# Patient Record
Sex: Male | Born: 1960
Health system: Southern US, Community
[De-identification: ages and names within clinical notes are randomized; demographics above are authoritative.]

## PROBLEM LIST (undated history)

## (undated) DIAGNOSIS — R519 Headache, unspecified: Secondary | ICD-10-CM

## (undated) DIAGNOSIS — E785 Hyperlipidemia, unspecified: Secondary | ICD-10-CM

## (undated) DIAGNOSIS — K219 Gastro-esophageal reflux disease without esophagitis: Secondary | ICD-10-CM

## (undated) DIAGNOSIS — G4730 Sleep apnea, unspecified: Secondary | ICD-10-CM

## (undated) DIAGNOSIS — Z87442 Personal history of urinary calculi: Secondary | ICD-10-CM

## (undated) DIAGNOSIS — I10 Essential (primary) hypertension: Secondary | ICD-10-CM

## (undated) HISTORY — PX: TONSILLECTOMY: SUR1361

## (undated) HISTORY — PX: PLANTAR FASCIA RELEASE: SHX2239

## (undated) HISTORY — PX: ROTATOR CUFF REPAIR: SHX139

## (undated) HISTORY — PX: CHOLECYSTECTOMY: SHX55

---

## 2011-02-21 ENCOUNTER — Observation Stay: Payer: Self-pay | Admitting: Surgery

## 2012-01-08 ENCOUNTER — Ambulatory Visit: Payer: Self-pay | Admitting: Radiology

## 2012-01-08 ENCOUNTER — Ambulatory Visit: Payer: Self-pay | Admitting: Orthopedic Surgery

## 2012-04-21 ENCOUNTER — Ambulatory Visit: Payer: Self-pay | Admitting: Unknown Physician Specialty

## 2013-01-27 ENCOUNTER — Emergency Department: Payer: Self-pay | Admitting: Emergency Medicine

## 2013-02-08 ENCOUNTER — Ambulatory Visit: Payer: Self-pay | Admitting: Gastroenterology

## 2014-05-28 DIAGNOSIS — K2100 Gastro-esophageal reflux disease with esophagitis, without bleeding: Secondary | ICD-10-CM | POA: Insufficient documentation

## 2014-05-28 DIAGNOSIS — K31A0 Gastric intestinal metaplasia, unspecified: Secondary | ICD-10-CM | POA: Insufficient documentation

## 2014-05-28 DIAGNOSIS — K3189 Other diseases of stomach and duodenum: Secondary | ICD-10-CM | POA: Insufficient documentation

## 2014-06-23 ENCOUNTER — Ambulatory Visit: Payer: Self-pay | Admitting: Anesthesiology

## 2014-06-23 DIAGNOSIS — I10 Essential (primary) hypertension: Secondary | ICD-10-CM

## 2014-06-27 ENCOUNTER — Ambulatory Visit: Payer: Self-pay | Admitting: Otolaryngology

## 2014-08-07 NOTE — Op Note (Signed)
PATIENT NAME:  Jonathan Lester, Jonathan Lester MR#:  [id number] DATE OF BIRTH:  1960-10-16  DATE OF PROCEDURE:  06/27/2014  PREOPERATIVE DIAGNOSES:  1.  Septal deviation.  2.  Turbinate hypertrophy.  3.  Airway obstruction.   POSTOPERATIVE DIAGNOSES:  1.  Septal deviation.  2.  Turbinate hypertrophy.  3.  Airway obstruction.   OPERATIVE PROCEDURES: 1.  Septoplasty.  2.  Bilateral inferior turbinate partial reduction.   SURGEON: Cammy CopaPaul H. Jalicia Roszak, M.Lester.   ANESTHESIA: General.   COMPLICATIONS: None.   DESCRIPTION OF PROCEDURE: The patient was given general anesthesia by oral endotracheal intubation. His nose was prepped using 6 mL of 1% Xylocaine with epi 1:200,000 for infiltration of the nasal septum and lateral nasal walls. Cottonoid pledgets soaked in phenylephrine and Xylocaine were then placed in both sides for vasoconstriction. He was prepped and draped in sterile fashion.   Cottonoid pledgets were removed. The nose was inspected on both sides. The septum had a very large spur inferiorly on the left side that went back and blocked much of the lower airway on the left side. The quadrangular cartilage and ethmoid plate buckled towards the right side superiorly. A left Killian incision was created with elevation of mucoperichondrium on the left side of the quadrangular plate. The cartilage was split just above the maxillary crest and at the bony cartilaginous junction. Some of the posterior-inferior quadrangular plate was removed. The quadrangular plate was horizontal as it coursed across the maxillary crest and pushed out into the left side. This lower portion was removed. Some of the crooked ethmoid plate was removed and this allowed the quadrangular plate to fall back more towards the midline. Maxillary crest was buckled way over to the left side. Mucoperiosteum was elevated on both sides of the maxillary crest and it was fractured more midline and some of the crooked part was removed. There was a vomer  spur posteriorly on the left side and this was removed as well. The mucosal flaps were then placed back in their anatomic position. There was a tear on the left side inferiorly where the septal spur was. This was sewn together with a through and through suture. A 4-0 plain gut suture was used to close that and the Killian incision and hold the flaps together in the midline. Once this was done you could see that the septum was lying in good position in the middle. The inferior turbinates were trimmed along their anterior-inferior border. Right side was bigger and had to be trimmed a little bit more. Electrocautery was used along the anterior/inferior border to help control bleeding. The remaining portion of the turbinate was then outfractured to help open up the airway further. The airways were then wide open on both sides. The middle turbinates were infractured some and a piece of xerogel was placed in the middle meatus on both sides to help keep the middle meatus more open. Xomed 0.5 mm regular size splints were then trimmed and placed on both sides of the septum, held in position with a 3-0 nylon through and through suture.   The patient tolerated the procedure well. He was awakened and taken to the recovery room in satisfactory condition. There were no operative complications.  ____________________________ Cammy CopaPaul H. Carole Doner, MD phj:sb Lester: 06/27/2014 10:30:39 ET T: 06/27/2014 10:48:39 ET JOB#: [id number]  cc: Cammy CopaPaul H. Braylei Totino, MD, <Dictator> Cammy CopaPAUL H Graceanne Guin MD ELECTRONICALLY SIGNED 06/28/2014 8:36

## 2016-04-19 ENCOUNTER — Ambulatory Visit
Admission: RE | Admit: 2016-04-19 | Discharge: 2016-04-19 | Disposition: A | Discharge status: Home / Self Care | Payer: BC Managed Care – PPO | Source: Ambulatory Visit | Attending: Internal Medicine | Admitting: Internal Medicine

## 2016-04-19 ENCOUNTER — Other Ambulatory Visit: Payer: Self-pay | Admitting: Internal Medicine

## 2016-04-19 DIAGNOSIS — R05 Cough: Secondary | ICD-10-CM

## 2016-04-19 DIAGNOSIS — R059 Cough, unspecified: Secondary | ICD-10-CM

## 2016-04-19 DIAGNOSIS — R509 Fever, unspecified: Secondary | ICD-10-CM | POA: Insufficient documentation

## 2017-01-23 ENCOUNTER — Encounter: Payer: Self-pay | Admitting: Emergency Medicine

## 2017-01-23 ENCOUNTER — Emergency Department
Admission: EM | Admit: 2017-01-23 | Discharge: 2017-01-23 | Disposition: A | Discharge status: Home / Self Care | Payer: BC Managed Care – PPO | Attending: Emergency Medicine | Admitting: Emergency Medicine

## 2017-01-23 ENCOUNTER — Emergency Department: Payer: BC Managed Care – PPO

## 2017-01-23 DIAGNOSIS — R109 Unspecified abdominal pain: Secondary | ICD-10-CM

## 2017-01-23 DIAGNOSIS — I10 Essential (primary) hypertension: Secondary | ICD-10-CM | POA: Diagnosis not present

## 2017-01-23 DIAGNOSIS — N200 Calculus of kidney: Secondary | ICD-10-CM | POA: Diagnosis not present

## 2017-01-23 HISTORY — DX: Essential (primary) hypertension: I10

## 2017-01-23 LAB — COMPREHENSIVE METABOLIC PANEL
ALK PHOS: 33 U/L — AB (ref 38–126)
ALT: 27 U/L (ref 17–63)
AST: 22 U/L (ref 15–41)
Albumin: 4.9 g/dL (ref 3.5–5.0)
Anion gap: 8 (ref 5–15)
BILIRUBIN TOTAL: 0.7 mg/dL (ref 0.3–1.2)
BUN: 27 mg/dL — ABNORMAL HIGH (ref 6–20)
CALCIUM: 9.2 mg/dL (ref 8.9–10.3)
CO2: 26 mmol/L (ref 22–32)
CREATININE: 1.18 mg/dL (ref 0.61–1.24)
Chloride: 104 mmol/L (ref 101–111)
Glucose, Bld: 115 mg/dL — ABNORMAL HIGH (ref 65–99)
Potassium: 3.7 mmol/L (ref 3.5–5.1)
Sodium: 138 mmol/L (ref 135–145)
TOTAL PROTEIN: 7.9 g/dL (ref 6.5–8.1)

## 2017-01-23 LAB — CBC
HCT: 39.7 % — ABNORMAL LOW (ref 40.0–52.0)
HEMOGLOBIN: 13.6 g/dL (ref 13.0–18.0)
MCH: 29.2 pg (ref 26.0–34.0)
MCHC: 34.2 g/dL (ref 32.0–36.0)
MCV: 85.3 fL (ref 80.0–100.0)
PLATELETS: 288 10*3/uL (ref 150–440)
RBC: 4.66 MIL/uL (ref 4.40–5.90)
RDW: 13.3 % (ref 11.5–14.5)
WBC: 9.1 10*3/uL (ref 3.8–10.6)

## 2017-01-23 LAB — URINALYSIS, COMPLETE (UACMP) WITH MICROSCOPIC
Bilirubin Urine: NEGATIVE
GLUCOSE, UA: NEGATIVE mg/dL
Hgb urine dipstick: NEGATIVE
KETONES UR: NEGATIVE mg/dL
Leukocytes, UA: NEGATIVE
NITRITE: NEGATIVE
PH: 6 (ref 5.0–8.0)
Protein, ur: NEGATIVE mg/dL
SPECIFIC GRAVITY, URINE: 1.016 (ref 1.005–1.030)
Squamous Epithelial / LPF: NONE SEEN

## 2017-01-23 LAB — LIPASE, BLOOD: Lipase: 19 U/L (ref 11–51)

## 2017-01-23 MED ORDER — OXYCODONE-ACETAMINOPHEN 5-325 MG PO TABS: 1.0000 | ORAL_TABLET | ORAL | 0 refills | Status: DC | PRN

## 2017-01-23 MED ORDER — ONDANSETRON 4 MG PO TBDP: 4.0000 mg | ORAL_TABLET | Freq: Three times a day (TID) | ORAL | 0 refills | Status: DC | PRN

## 2017-01-23 MED ORDER — TAMSULOSIN HCL 0.4 MG PO CAPS: 0.4000 mg | ORAL_CAPSULE | Freq: Every day | ORAL | 0 refills | Status: AC

## 2017-01-23 MED ORDER — SODIUM CHLORIDE 0.9 % IV BOLUS (SEPSIS)
1000.0000 mL | Freq: Once | INTRAVENOUS | Status: AC
  Administered 2017-01-23: 1000 mL via INTRAVENOUS

## 2017-01-23 MED ORDER — IBUPROFEN 800 MG PO TABS: 800.0000 mg | ORAL_TABLET | Freq: Three times a day (TID) | ORAL | 0 refills | Status: DC | PRN

## 2017-01-23 MED ORDER — TAMSULOSIN HCL 0.4 MG PO CAPS
0.4000 mg | ORAL_CAPSULE | Freq: Once | ORAL | Status: AC
  Administered 2017-01-23: 0.4 mg via ORAL
  Filled 2017-01-23: qty 1

## 2017-01-23 MED ORDER — KETOROLAC TROMETHAMINE 30 MG/ML IJ SOLN
30.0000 mg | Freq: Once | INTRAMUSCULAR | Status: AC
  Administered 2017-01-23: 30 mg via INTRAVENOUS
  Filled 2017-01-23: qty 1

## 2017-01-23 NOTE — ED Provider Notes (Signed)
[facility] Emergency Department Provider Note   ____________________________________________   First MD Initiated Contact with Patient 01/23/17 0402     (approximate)  I have reviewed the triage vital signs and the nursing notes.   HISTORY  Chief Complaint Flank Pain    HPI Jonathan Lester is a 56 y.o. male who presents to the ED from home with a chief complaint of left flank pain and nausea. Onset of left flank pain 4 days ago, waxing/waning with worsening symptoms last evening. Symptoms associated with nausea. Patient denies recent fever, chills, chest pain, shortness of breath, abdominal pain, hematuria, vomiting. Denies recent travel or trauma. Nothing makes his symptoms better or worse.   Past Medical History:  Diagnosis Date  . Hypertension     There are no active problems to display for this patient.   History reviewed. No pertinent surgical history.  Prior to Admission medications   Medication Sig Start Date End Date Taking? Authorizing Provider  ibuprofen (ADVIL,MOTRIN) 800 MG tablet Take 1 tablet (800 mg total) by mouth every 8 (eight) hours as needed for moderate pain. 01/23/17   Irean Hong, MD  ondansetron (ZOFRAN ODT) 4 MG disintegrating tablet Take 1 tablet (4 mg total) by mouth every 8 (eight) hours as needed for nausea or vomiting. 01/23/17   Irean Hong, MD  oxyCODONE-acetaminophen (ROXICET) 5-325 MG tablet Take 1 tablet by mouth every 4 (four) hours as needed for severe pain. 01/23/17   Irean Hong, MD  tamsulosin (FLOMAX) 0.4 MG CAPS capsule Take 1 capsule (0.4 mg total) by mouth daily. 01/23/17   Irean Hong, MD    Allergies Patient has no known allergies.  History reviewed. No pertinent family history.  Social History Social History  Substance Use Topics  . Smoking status: Never Smoker  . Smokeless tobacco: Never Used  . Alcohol use Not on file    Review of Systems  Constitutional: No fever/chills. Eyes: No  visual changes. ENT: No sore throat. Cardiovascular: Denies chest pain. Respiratory: Denies shortness of breath. Gastrointestinal: positive for left flank pain. No abdominal pain.  No nausea, no vomiting.  No diarrhea.  No constipation. Genitourinary: Negative for dysuria. Musculoskeletal: Negative for back pain. Skin: Negative for rash. Neurological: Negative for headaches, focal weakness or numbness.   ____________________________________________   PHYSICAL EXAM:  VITAL SIGNS: ED Triage Vitals [01/23/17 0146]  Enc Vitals Group     BP (!) 155/91     Pulse Rate 83     Resp 20     Temp 97.9 F (36.6 C)     Temp Source Oral     SpO2 98 %     Weight      Height      Head Circumference      Peak Flow      Pain Score      Pain Loc      Pain Edu?      Excl. in GC?     Constitutional: Alert and oriented. Well appearing and in mild acute distress. Eyes: Conjunctivae are normal. PERRL. EOMI. Head: Atraumatic. Nose: No congestion/rhinnorhea. Mouth/Throat: Mucous membranes are moist.  Oropharynx non-erythematous. Neck: No stridor.   Cardiovascular: Normal rate, regular rhythm. Grossly normal heart sounds.  Good peripheral circulation. Respiratory: Normal respiratory effort.  No retractions. Lungs CTAB. Gastrointestinal: Soft and nontender to light or deep palpation. No distention. No abdominal bruits. Mild left CVA tenderness. Musculoskeletal: No lower extremity tenderness nor edema.  No joint  effusions. Neurologic:  Normal speech and language. No gross focal neurologic deficits are appreciated. No gait instability. Skin:  Skin is warm, dry and intact. No rash noted. No vesicles. Psychiatric: Mood and affect are normal. Speech and behavior are normal.  ____________________________________________   LABS (all labs ordered are listed, but only abnormal results are displayed)  Labs Reviewed  COMPREHENSIVE METABOLIC PANEL - Abnormal; Notable for the following:       Result  Value   Glucose, Bld 115 (*)    BUN 27 (*)    Alkaline Phosphatase 33 (*)    All other components within normal limits  CBC - Abnormal; Notable for the following:    HCT 39.7 (*)    All other components within normal limits  URINALYSIS, COMPLETE (UACMP) WITH MICROSCOPIC - Abnormal; Notable for the following:    Color, Urine YELLOW (*)    APPearance CLEAR (*)    Bacteria, UA RARE (*)    All other components within normal limits  LIPASE, BLOOD   ____________________________________________  EKG  None ____________________________________________  RADIOLOGY  Ct Renal Stone Study  Result Date: 01/23/2017 CLINICAL DATA:  Left flank pain since Monday.  Worse on Wednesday. EXAM: CT ABDOMEN AND PELVIS WITHOUT CONTRAST TECHNIQUE: Multidetector CT imaging of the abdomen and pelvis was performed following the standard protocol without IV contrast. COMPARISON:  02/21/2011 FINDINGS: Lower chest: Mild dependent changes in the lung bases. Hepatobiliary: No focal liver abnormality is seen. Status post cholecystectomy. No biliary dilatation. Pancreas: Unremarkable. No pancreatic ductal dilatation or surrounding inflammatory changes. Spleen: Normal in size without focal abnormality. Adrenals/Urinary Tract: No adrenal gland nodules. There is mild left hydronephrosis and hydroureter with stranding around the left kidney and ureter. No ureteral stones are demonstrated but there is a 2 mm stone in the dependent bladder likely representing a recently passed stone from the left ureter. Additional intrarenal stones are noted bilaterally. No hydronephrosis or hydroureter on the right. Cyst on the left kidney lower pole. Bladder wall is not thickened. Stomach/Bowel: Stomach is within normal limits. Appendix appears normal. No evidence of bowel wall thickening, distention, or inflammatory changes. Vascular/Lymphatic: No significant vascular findings are present. No enlarged abdominal or pelvic lymph nodes.  Reproductive: Prostate is unremarkable. Other: No abdominal wall hernia or abnormality. No abdominopelvic ascites. Musculoskeletal: No acute or significant osseous findings. IMPRESSION: 1. Left hydronephrosis and hydroureter with 2 mm stone in the bladder, likely representing a recently passed stone. 2. Bilateral nonobstructing intrarenal stones. Electronically Signed   By: Burman NievesWilliam  Stevens M.D.   On: 01/23/2017 04:54    ____________________________________________   PROCEDURES  Procedure(s) performed: None  Procedures  Critical Care performed: No  ____________________________________________   INITIAL IMPRESSION / ASSESSMENT AND PLAN / ED COURSE  As part of my medical decision making, I reviewed the following data within the electronic MEDICAL RECORD NUMBER Nursing notes reviewed and incorporated, Radiograph reviewed and Notes from prior ED visits.   56 year old male who presents with nontraumatic left flank pain. Differential diagnosis includes, but is not limited to, acute appendicitis, renal colic, testicular torsion, urinary tract infection/pyelonephritis, prostatitis,  epididymitis, diverticulitis, small bowel obstruction or ileus, colitis, abdominal aortic aneurysm, gastroenteritis, hernia, etc. Laboratory and urinalysis results unremarkable. Will initiate IV fluid resuscitation, NSAIDs, and obtain CT renal colic study.  Clinical Course as of Jan 24 544  Thu Jan 23, 2017  [id number] Pain improved after Toradol. Updated patient of urinalysis and CT imaging results. Strict return precautions given. Patient verbalizes understanding and agrees with plan  of care.  [JS]    Clinical Course User Index [JS] Irean Hong, MD     ____________________________________________   FINAL CLINICAL IMPRESSION(S) / ED DIAGNOSES  Final diagnoses:  Left flank pain  Kidney stone      NEW MEDICATIONS STARTED DURING THIS VISIT:  Discharge Medication List as of 01/23/2017  5:30 AM    START  taking these medications   Details  ibuprofen (ADVIL,MOTRIN) 800 MG tablet Take 1 tablet (800 mg total) by mouth every 8 (eight) hours as needed for moderate pain., Starting Thu 01/23/2017, Print    ondansetron (ZOFRAN ODT) 4 MG disintegrating tablet Take 1 tablet (4 mg total) by mouth every 8 (eight) hours as needed for nausea or vomiting., Starting Thu 01/23/2017, Print    oxyCODONE-acetaminophen (ROXICET) 5-325 MG tablet Take 1 tablet by mouth every 4 (four) hours as needed for severe pain., Starting Thu 01/23/2017, Print    tamsulosin (FLOMAX) 0.4 MG CAPS capsule Take 1 capsule (0.4 mg total) by mouth daily., Starting Thu 01/23/2017, Print         Note:  This document was prepared using Dragon voice recognition software and may include unintentional dictation errors.    Irean Hong, MD 01/23/17 [phone]

## 2017-01-23 NOTE — Discharge Instructions (Signed)
1. Take pain & nausea medicines as needed (Motrin/Percocet/Zofran #20). Make sure to take a stool softener while taking narcotic pain medicines. 2. Take Flomax 0.4mg daily x 14 days. 3. Drink plenty of bottled or filtered water daily. 4. Return to the ER for worsening symptoms, persistent vomiting, fever, difficulty breathing or other concerns.  

## 2017-01-23 NOTE — ED Triage Notes (Signed)
Pt c/o left flank pain since Monday with worsening symptoms starting on Wednesday at 1800. Pt experiencing pain with all movements. Pt denies urinary symptoms. Pt denies N/V.

## 2018-06-24 ENCOUNTER — Other Ambulatory Visit: Payer: Self-pay

## 2018-06-24 ENCOUNTER — Ambulatory Visit (INDEPENDENT_AMBULATORY_CARE_PROVIDER_SITE_OTHER): Payer: BC Managed Care – PPO

## 2018-06-24 ENCOUNTER — Ambulatory Visit: Payer: BC Managed Care – PPO | Admitting: Podiatry

## 2018-06-24 DIAGNOSIS — M779 Enthesopathy, unspecified: Secondary | ICD-10-CM | POA: Diagnosis not present

## 2018-06-24 DIAGNOSIS — M722 Plantar fascial fibromatosis: Secondary | ICD-10-CM

## 2018-06-24 DIAGNOSIS — M778 Other enthesopathies, not elsewhere classified: Secondary | ICD-10-CM

## 2018-06-24 MED ORDER — METHYLPREDNISOLONE 4 MG PO TBPK: ORAL_TABLET | ORAL | 0 refills | Status: DC

## 2018-06-24 NOTE — Progress Notes (Signed)
Subjective:  Patient ID: Jonathan Lester, male    DOB: 07/03/1960,  MRN: [id number] HPI Chief Complaint  Patient presents with  . Foot Pain    Plantar forefoot right and plantar heel left - aching x 1 month, wore bad sneakers [place] in [place] and noticed pain since, previous heel surgery in [place], taking advil  . New Patient (Initial Visit)    58 y.o. male presents with the above complaint.   ROS: Denies fever chills nausea vomiting muscle aches pains calf pain back pain chest pain shortness of breath.  Past Medical History:  Diagnosis Date  . Hypertension    No past surgical history on file.  Current Outpatient Medications:  .  PANTOPRAZOLE SODIUM PO, Take by mouth., Disp: , Rfl:  .  fenofibrate 160 MG tablet, TK 1 T PO ONCE D, Disp: , Rfl:  .  ibuprofen (ADVIL,MOTRIN) 800 MG tablet, Take 1 tablet (800 mg total) by mouth every 8 (eight) hours as needed for moderate pain., Disp: 15 tablet, Rfl: 0 .  irbesartan (AVAPRO) 300 MG tablet, TK 1 T PO ONCE D, Disp: , Rfl:  .  meloxicam (MOBIC) 7.5 MG tablet, TK 1 T PO QD PRN, Disp: , Rfl:  .  methylPREDNISolone (MEDROL DOSEPAK) 4 MG TBPK tablet, 6 day dose pack - take as directed, Disp: 21 tablet, Rfl: 0 .  simvastatin (ZOCOR) 40 MG tablet, TK 1 T PO QD, Disp: , Rfl:  .  tamsulosin (FLOMAX) 0.4 MG CAPS capsule, Take 1 capsule (0.4 mg total) by mouth daily., Disp: 14 capsule, Rfl: 0  Allergies  Allergen Reactions  . Codeine Nausea Only   Review of Systems Objective:  There were no vitals filed for this visit.  General: Well developed, nourished, in no acute distress, alert and oriented x3   Dermatological: Skin is warm, dry and supple bilateral. Nails x 10 are well maintained; remaining integument appears unremarkable at this time. There are no open sores, no preulcerative lesions, no rash or signs of infection present.  Vascular: Dorsalis Pedis artery and Posterior Tibial artery pedal pulses are 2/4 bilateral with immedate  capillary fill time. Pedal hair growth present. No varicosities and no lower extremity edema present bilateral.   Neruologic: Grossly intact via light touch bilateral. Vibratory intact via tuning fork bilateral. Protective threshold with Semmes Wienstein monofilament intact to all pedal sites bilateral. Patellar and Achilles deep tendon reflexes 2+ bilateral. No Babinski or clonus noted bilateral.   Musculoskeletal: No gross boney pedal deformities bilateral. No pain, crepitus, or limitation noted with foot and ankle range of motion bilateral. Muscular strength 5/5 in all groups tested bilateral.  Pain on end range of motion of the second metatarsal phalangeal joint of the right foot.  No erythema edema cellulitis drainage or odor.  Positive pain on palpation medial calcaneal tubercle and to some degree plantar calcaneal tubercle left heel.  Gait: Unassisted, Nonantalgic.    Radiographs:  Radiographs taken today do not demonstrate any acute findings.  Mild soft tissue swelling at the plantar fascial calcaneal insertion site.  Assessment & Plan:   Assessment: Capsulitis second metatarsal phalangeal joint right foot plantar fasciitis left foot.    Plan: Discussed etiology pathology conservative surgical therapies I injected the second metatarsal phalangeal joint with 20 mg Kenalog 5 mg Marcaine after sterile Betadine skin prep.  Also injected the left heel similarly tolerated both procedures well without complications.  Started him on a Medrol Dosepak he will then follow back up with  his meloxicam 7.5 mg also dispensed a plantar fascial brace left.  I will follow-up with him in 1 month     Jonathan Lester T. [place], [place]

## 2018-07-29 ENCOUNTER — Other Ambulatory Visit: Payer: Self-pay

## 2018-07-29 ENCOUNTER — Ambulatory Visit: Payer: BC Managed Care – PPO | Admitting: Podiatry

## 2018-07-29 ENCOUNTER — Encounter: Payer: Self-pay | Admitting: Podiatry

## 2018-07-29 VITALS — Temp 96.4°F

## 2018-07-29 DIAGNOSIS — M722 Plantar fascial fibromatosis: Secondary | ICD-10-CM | POA: Diagnosis not present

## 2018-07-29 NOTE — Progress Notes (Signed)
He presents today for follow-up of capsulitis second metatarsal phalangeal joint of the right foot and plantar fasciitis of the left heel.  He states that the right foot is doing great the left heel really has not changed much maybe 15 to 20% is all of the reduction in symptoms that he has had with the left foot.  He does relate having orthotics approximately 16 years ago when he had surgery to that left heel.  Objective: Vital signs are stable he is alert and oriented x3.  Pulses are palpable.  Right foot is in good shape has no pain on palpation of the second metatarsal phalangeal joint nor on range of motion.  Left heel does have pain on palpation to the deep medial band of the plantar fascia as well as to the lateral aspect of the heel.  Assessment: Lateral compensation secondary to medial plantar fasciitis of the left foot resolution of capsulitis second metatarsal phalangeal joint right foot.  Plan: I injected his left heel once again today with 20 mg of Kenalog 5 mg Marcaine point of maximal tenderness of the left heel.  Tolerated procedure well without complications we also went ahead and had a new set of orthotics made since were Raiford Noble was here today to have that done and I will follow-up with Trey Paula who is a Therapist, occupational judge in about 1 month.

## 2018-08-19 ENCOUNTER — Ambulatory Visit: Payer: BC Managed Care – PPO | Admitting: Orthotics

## 2018-08-19 ENCOUNTER — Other Ambulatory Visit: Payer: Self-pay

## 2018-08-19 DIAGNOSIS — M778 Other enthesopathies, not elsewhere classified: Secondary | ICD-10-CM

## 2018-08-19 DIAGNOSIS — M722 Plantar fascial fibromatosis: Secondary | ICD-10-CM

## 2018-08-19 NOTE — Progress Notes (Signed)
Patient came in today to pick up custom made foot orthotics.  The goals were accomplished and the patient reported no dissatisfaction with said orthotics.  Patient was advised of breakin period and how to report any issues. 

## 2018-09-09 ENCOUNTER — Encounter: Payer: Self-pay | Admitting: Podiatry

## 2018-09-09 ENCOUNTER — Ambulatory Visit: Payer: BC Managed Care – PPO | Admitting: Podiatry

## 2018-09-09 ENCOUNTER — Other Ambulatory Visit: Payer: Self-pay

## 2018-09-09 VITALS — Temp 97.0°F

## 2018-09-09 DIAGNOSIS — M722 Plantar fascial fibromatosis: Secondary | ICD-10-CM | POA: Diagnosis not present

## 2018-09-09 NOTE — Progress Notes (Signed)
He presents today for bilateral foot pain states that the capsulitis is getting better only have a little bit of pain and that occasionally I do have some pain in his left heel.  Orthotics seem to be helping a lot but after the orthotics have been on for most of the day they tend to bother me.  He states that he does not wear the orthotics all the time primarily in his tennis shoes.  He is unable to wear those to work.  Objective: Vital signs are stable alert and oriented x3.  Pulses are palpable.  Neurologic sensorium is intact.  He has no pain on palpation second metatarsal phalangeal joint of the right foot though he does have pain on palpation of the left heel.  Assessment: Resolving capsulitis second metatarsal phalangeal joint right capsulitis plantar fasciitis bursitis left heel.  Plan: After sterile Betadine skin prep I injected left heel 20 mg Kenalog 5 mg Marcaine point of maximal tenderness.  Tolerated procedure well without complications.  I recommended that he get back into his orthotics and wear them on a regular basis.  Should this recur then an MRI may be necessary for reevaluation of his condition.

## 2018-10-21 ENCOUNTER — Ambulatory Visit: Payer: BC Managed Care – PPO | Admitting: Podiatry

## 2018-11-11 ENCOUNTER — Ambulatory Visit: Payer: BC Managed Care – PPO | Admitting: Podiatry

## 2018-12-21 ENCOUNTER — Other Ambulatory Visit: Payer: Self-pay | Admitting: Podiatry

## 2018-12-22 ENCOUNTER — Ambulatory Visit
Admission: RE | Admit: 2018-12-22 | Discharge: 2018-12-22 | Disposition: A | Discharge status: Home / Self Care | Payer: BC Managed Care – PPO | Source: Ambulatory Visit | Attending: Internal Medicine | Admitting: Internal Medicine

## 2018-12-22 ENCOUNTER — Other Ambulatory Visit: Payer: Self-pay

## 2018-12-22 ENCOUNTER — Encounter
Admission: RE | Admit: 2018-12-22 | Discharge: 2018-12-22 | Disposition: A | Discharge status: Home / Self Care | Payer: BC Managed Care – PPO | Source: Ambulatory Visit | Attending: Podiatry | Admitting: Podiatry

## 2018-12-22 DIAGNOSIS — Z01818 Encounter for other preprocedural examination: Secondary | ICD-10-CM | POA: Diagnosis present

## 2018-12-22 DIAGNOSIS — Z20828 Contact with and (suspected) exposure to other viral communicable diseases: Secondary | ICD-10-CM | POA: Insufficient documentation

## 2018-12-22 DIAGNOSIS — Z01812 Encounter for preprocedural laboratory examination: Secondary | ICD-10-CM | POA: Insufficient documentation

## 2018-12-22 HISTORY — DX: Hyperlipidemia, unspecified: E78.5

## 2018-12-22 HISTORY — DX: Sleep apnea, unspecified: G47.30

## 2018-12-22 HISTORY — DX: Personal history of urinary calculi: Z87.442

## 2018-12-22 NOTE — Patient Instructions (Addendum)
Your procedure is scheduled on: Friday 12/25/18.  Report to DAY SURGERY DEPARTMENT LOCATED ON 2ND FLOOR MEDICAL MALL ENTRANCE. To find out your arrival time please call [phone] between 1PM - 3PM on Thursday 12/23/17.   Remember: Instructions that are not followed completely may result in serious medical risk, up to and including death, or upon the discretion of your surgeon and anesthesiologist your surgery may need to be rescheduled.      _X__ 1. Do not eat food after midnight the night before your procedure.                 No gum chewing or hard candies. You may drink clear liquids up to 2 hours                 before you are scheduled to arrive for your surgery- DO NOT drink clear                 liquids within 2 hours of the start of your surgery.                 Clear Liquids include:  water, apple juice without pulp, clear carbohydrate                 drink such as Clearfast or Gatorade, Black Coffee or Tea (Do not add                 milk or creamer to coffee or tea).  **Please finish the Ensure Pre-Surgery Drink 2-3 hours before your arrival time. You may drink it at room temperature or refrigerated. Dr. Cleda Mccreedy has ordered this drink for you.  __X__2.  On the morning of surgery brush your teeth with toothpaste and water, you may rinse your mouth with mouthwash if you wish.  Do not swallow any toothpaste or mouthwash.     __X__3.  Notify your doctor if there is any change in your medical condition      (cold, fever, infections).       Do not wear jewelry, make-up, hairpins, clips or nail polish. Do not wear lotions, powders, or perfumes.  Do not shave 48 hours prior to surgery. Men may shave face and neck. Do not bring valuables to the hospital.     [facility] is not responsible for any belongings or valuables.   Contacts, dentures/partials or body piercings may not be worn into surgery. Bring a case for your contacts, glasses or hearing aids, a denture cup will be  supplied.   Patients discharged the day of surgery will not be allowed to drive home.    Please read over the following fact sheets that you were given:   MRSA Information    __X__ Take these medicines the morning of surgery with A SIP OF WATER:     1. pantoprazole (PROTONIX) 40 MG tablet  2. oxymetazoline (AFRIN) 0.05 % nasal spray if needed     __X__ Use CHG Soap as directed     __X__ Stop Anti-inflammatories 7 days before surgery such as Advil, Ibuprofen, Motrin, BC or Goodies Powder, Naprosyn, Naproxen, Aleve, Aspirin, Excedrin, Meloxicam. May take Tylenol if needed for pain or discomfort.

## 2018-12-23 LAB — SARS CORONAVIRUS 2 (TAT 6-24 HRS): SARS Coronavirus 2: NEGATIVE

## 2018-12-25 ENCOUNTER — Encounter
Admission: RE | Disposition: A | Discharge status: Home / Self Care | Payer: Self-pay | Source: Home / Self Care | Attending: Podiatry

## 2018-12-25 ENCOUNTER — Other Ambulatory Visit: Payer: Self-pay

## 2018-12-25 ENCOUNTER — Ambulatory Visit: Payer: BC Managed Care – PPO | Admitting: Certified Registered"

## 2018-12-25 ENCOUNTER — Encounter: Payer: Self-pay | Admitting: *Deleted

## 2018-12-25 ENCOUNTER — Ambulatory Visit
Admission: RE | Admit: 2018-12-25 | Discharge: 2018-12-25 | Disposition: A | Discharge status: Home / Self Care | Payer: BC Managed Care – PPO | Attending: Podiatry | Admitting: Podiatry

## 2018-12-25 DIAGNOSIS — E785 Hyperlipidemia, unspecified: Secondary | ICD-10-CM | POA: Diagnosis not present

## 2018-12-25 DIAGNOSIS — G4730 Sleep apnea, unspecified: Secondary | ICD-10-CM | POA: Insufficient documentation

## 2018-12-25 DIAGNOSIS — Z79899 Other long term (current) drug therapy: Secondary | ICD-10-CM | POA: Diagnosis not present

## 2018-12-25 DIAGNOSIS — I10 Essential (primary) hypertension: Secondary | ICD-10-CM | POA: Insufficient documentation

## 2018-12-25 DIAGNOSIS — Z791 Long term (current) use of non-steroidal anti-inflammatories (NSAID): Secondary | ICD-10-CM | POA: Insufficient documentation

## 2018-12-25 DIAGNOSIS — M722 Plantar fascial fibromatosis: Secondary | ICD-10-CM | POA: Insufficient documentation

## 2018-12-25 HISTORY — PX: PLANTAR FASCIA RELEASE: SHX2239

## 2018-12-25 SURGERY — RELEASE, FASCIA, PLANTAR
Anesthesia: General | Laterality: Left

## 2018-12-25 MED ORDER — ONDANSETRON HCL 4 MG/2ML IJ SOLN
INTRAMUSCULAR | Status: DC | PRN
  Administered 2018-12-25: 4 mg via INTRAVENOUS

## 2018-12-25 MED ORDER — OXYCODONE HCL 5 MG PO TABS
ORAL_TABLET | ORAL | Status: AC
  Administered 2018-12-25: 09:00:00 5 mg via ORAL
  Filled 2018-12-25: qty 1

## 2018-12-25 MED ORDER — CEFAZOLIN SODIUM-DEXTROSE 2-4 GM/100ML-% IV SOLN
INTRAVENOUS | Status: AC
  Filled 2018-12-25: qty 100

## 2018-12-25 MED ORDER — FENTANYL CITRATE (PF) 100 MCG/2ML IJ SOLN
25.0000 ug | INTRAMUSCULAR | Status: DC | PRN
  Administered 2018-12-25: 25 ug via INTRAVENOUS
  Administered 2018-12-25: 50 ug via INTRAVENOUS
  Administered 2018-12-25: 09:00:00 25 ug via INTRAVENOUS

## 2018-12-25 MED ORDER — EPHEDRINE SULFATE 50 MG/ML IJ SOLN
INTRAMUSCULAR | Status: AC
  Filled 2018-12-25: qty 1

## 2018-12-25 MED ORDER — PROMETHAZINE HCL 25 MG/ML IJ SOLN: 6.2500 mg | INTRAMUSCULAR | Status: DC | PRN

## 2018-12-25 MED ORDER — LACTATED RINGERS IV SOLN
INTRAVENOUS | Status: DC
  Administered 2018-12-25: 07:00:00 via INTRAVENOUS

## 2018-12-25 MED ORDER — MIDAZOLAM HCL 2 MG/2ML IJ SOLN
INTRAMUSCULAR | Status: DC | PRN
  Administered 2018-12-25: 2 mg via INTRAVENOUS

## 2018-12-25 MED ORDER — BUPIVACAINE HCL (PF) 0.5 % IJ SOLN
INTRAMUSCULAR | Status: AC
  Filled 2018-12-25: qty 30

## 2018-12-25 MED ORDER — MIDAZOLAM HCL 2 MG/2ML IJ SOLN
INTRAMUSCULAR | Status: AC
  Filled 2018-12-25: qty 2

## 2018-12-25 MED ORDER — LIDOCAINE HCL (PF) 1 % IJ SOLN
INTRAMUSCULAR | Status: AC
  Filled 2018-12-25: qty 30

## 2018-12-25 MED ORDER — PROPOFOL 10 MG/ML IV BOLUS
INTRAVENOUS | Status: AC
  Filled 2018-12-25: qty 40

## 2018-12-25 MED ORDER — OXYCODONE HCL 5 MG PO TABS
5.0000 mg | ORAL_TABLET | Freq: Once | ORAL | Status: AC | PRN
  Administered 2018-12-25: 09:00:00 5 mg via ORAL

## 2018-12-25 MED ORDER — PHENYLEPHRINE HCL (PRESSORS) 10 MG/ML IV SOLN
INTRAVENOUS | Status: DC | PRN
  Administered 2018-12-25: 150 ug via INTRAVENOUS
  Administered 2018-12-25: 100 ug via INTRAVENOUS

## 2018-12-25 MED ORDER — FENTANYL CITRATE (PF) 100 MCG/2ML IJ SOLN
INTRAMUSCULAR | Status: AC
  Filled 2018-12-25: qty 2

## 2018-12-25 MED ORDER — PROPOFOL 10 MG/ML IV BOLUS
INTRAVENOUS | Status: DC | PRN
  Administered 2018-12-25: 200 mg via INTRAVENOUS

## 2018-12-25 MED ORDER — KETOROLAC TROMETHAMINE 30 MG/ML IJ SOLN
INTRAMUSCULAR | Status: DC | PRN
  Administered 2018-12-25: 30 mg via INTRAVENOUS

## 2018-12-25 MED ORDER — DEXAMETHASONE SODIUM PHOSPHATE 10 MG/ML IJ SOLN
INTRAMUSCULAR | Status: AC
  Filled 2018-12-25: qty 1

## 2018-12-25 MED ORDER — FENTANYL CITRATE (PF) 100 MCG/2ML IJ SOLN
INTRAMUSCULAR | Status: DC | PRN
  Administered 2018-12-25: 50 ug via INTRAVENOUS
  Administered 2018-12-25 (×2): 25 ug via INTRAVENOUS
  Administered 2018-12-25: 50 ug via INTRAVENOUS
  Administered 2018-12-25 (×2): 25 ug via INTRAVENOUS

## 2018-12-25 MED ORDER — LIDOCAINE HCL (PF) 2 % IJ SOLN
INTRAMUSCULAR | Status: AC
  Filled 2018-12-25: qty 10

## 2018-12-25 MED ORDER — MEPERIDINE HCL 50 MG/ML IJ SOLN: 6.2500 mg | INTRAMUSCULAR | Status: DC | PRN

## 2018-12-25 MED ORDER — FENTANYL CITRATE (PF) 100 MCG/2ML IJ SOLN
INTRAMUSCULAR | Status: AC
  Administered 2018-12-25: 25 ug via INTRAVENOUS
  Filled 2018-12-25: qty 2

## 2018-12-25 MED ORDER — EPHEDRINE SULFATE 50 MG/ML IJ SOLN
INTRAMUSCULAR | Status: DC | PRN
  Administered 2018-12-25: 10 mg via INTRAVENOUS
  Administered 2018-12-25: 5 mg via INTRAVENOUS
  Administered 2018-12-25: 10 mg via INTRAVENOUS

## 2018-12-25 MED ORDER — LIDOCAINE HCL (CARDIAC) PF 100 MG/5ML IV SOSY
PREFILLED_SYRINGE | INTRAVENOUS | Status: DC | PRN
  Administered 2018-12-25: 100 mg via INTRAVENOUS

## 2018-12-25 MED ORDER — OXYCODONE HCL 5 MG/5ML PO SOLN: 5.0000 mg | Freq: Once | ORAL | Status: AC | PRN

## 2018-12-25 MED ORDER — CHLORHEXIDINE GLUCONATE 4 % EX LIQD: 60.0000 mL | Freq: Once | CUTANEOUS | Status: DC

## 2018-12-25 MED ORDER — DEXAMETHASONE SODIUM PHOSPHATE 10 MG/ML IJ SOLN
INTRAMUSCULAR | Status: DC | PRN
  Administered 2018-12-25: 10 mg via INTRAVENOUS

## 2018-12-25 MED ORDER — CEFAZOLIN SODIUM-DEXTROSE 2-4 GM/100ML-% IV SOLN
2.0000 g | INTRAVENOUS | Status: AC
  Administered 2018-12-25: 08:00:00 2 g via INTRAVENOUS

## 2018-12-25 MED ORDER — BUPIVACAINE HCL (PF) 0.5 % IJ SOLN
INTRAMUSCULAR | Status: DC | PRN
  Administered 2018-12-25: 17 mL

## 2018-12-25 MED ORDER — ONDANSETRON HCL 4 MG/2ML IJ SOLN
INTRAMUSCULAR | Status: AC
  Filled 2018-12-25: qty 2

## 2018-12-25 MED ORDER — KETOROLAC TROMETHAMINE 30 MG/ML IJ SOLN
INTRAMUSCULAR | Status: AC
  Filled 2018-12-25: qty 1

## 2018-12-25 SURGICAL SUPPLY — 34 items
BNDG CONFORM 2 STRL LF (GAUZE/BANDAGES/DRESSINGS) ×3 IMPLANT
BNDG CONFORM 3 STRL LF (GAUZE/BANDAGES/DRESSINGS) ×3 IMPLANT
BNDG ELASTIC 4X5.8 VLCR NS LF (GAUZE/BANDAGES/DRESSINGS) ×6 IMPLANT
BNDG ESMARK 4X12 TAN STRL LF (GAUZE/BANDAGES/DRESSINGS) ×3 IMPLANT
BNDG GAUZE 4.5X4.1 6PLY STRL (MISCELLANEOUS) ×6 IMPLANT
CANISTER SUCT 1200ML W/VALVE (MISCELLANEOUS) ×3 IMPLANT
COVER WAND RF STERILE (DRAPES) ×3 IMPLANT
CUFF TOURN SGL QUICK 18X4 (TOURNIQUET CUFF) ×3 IMPLANT
CUFF TOURN SGL QUICK 24 (TOURNIQUET CUFF)
CUFF TRNQT CYL 24X4X16.5-23 (TOURNIQUET CUFF) IMPLANT
DURAPREP 26ML APPLICATOR (WOUND CARE) ×3 IMPLANT
ELECT REM PT RETURN 9FT ADLT (ELECTROSURGICAL) ×3
ELECTRODE REM PT RTRN 9FT ADLT (ELECTROSURGICAL) ×1 IMPLANT
GAUZE SPONGE 4X4 12PLY STRL (GAUZE/BANDAGES/DRESSINGS) ×3 IMPLANT
GAUZE XEROFORM 1X8 LF (GAUZE/BANDAGES/DRESSINGS) ×3 IMPLANT
GLOVE BIO SURGEON STRL SZ7.5 (GLOVE) ×3 IMPLANT
GLOVE INDICATOR 8.0 STRL GRN (GLOVE) ×3 IMPLANT
GOWN STRL REUS W/ TWL LRG LVL3 (GOWN DISPOSABLE) ×2 IMPLANT
GOWN STRL REUS W/TWL LRG LVL3 (GOWN DISPOSABLE) ×4
KIT TURNOVER KIT A (KITS) ×3 IMPLANT
LABEL OR SOLS (LABEL) ×3 IMPLANT
NDL SAFETY ECLIPSE 18X1.5 (NEEDLE) ×1 IMPLANT
NEEDLE HYPO 18GX1.5 SHARP (NEEDLE) ×2
NEEDLE HYPO 25X1 1.5 SAFETY (NEEDLE) ×3 IMPLANT
NS IRRIG 1000ML POUR BTL (IV SOLUTION) ×3 IMPLANT
NS IRRIG 500ML POUR BTL (IV SOLUTION) ×3 IMPLANT
PACK EXTREMITY ARMC (MISCELLANEOUS) ×3 IMPLANT
PAD PREP 24X41 OB/GYN DISP (PERSONAL CARE ITEMS) ×3 IMPLANT
PENCIL ELECTRO HAND CTR (MISCELLANEOUS) ×3 IMPLANT
SPLINT CAST 1 STEP 4X30 (MISCELLANEOUS) ×3 IMPLANT
STOCKINETTE M/LG 89821 (MISCELLANEOUS) ×3 IMPLANT
SUT VIC AB 4-0 FS2 27 (SUTURE) ×3 IMPLANT
SUT VICRYL+ 3-0 36IN CT-1 (SUTURE) ×3 IMPLANT
SYR 10ML LL (SYRINGE) ×3 IMPLANT

## 2018-12-25 NOTE — Discharge Instructions (Addendum)
1.  Elevate left lower extremity on 2 pillows.  2.  Keep the bandage and splint on the left leg clean, dry, and do not remove.  3.  Sponge bathe only left lower extremity.  4.  No weight on left foot using crutches.  5.  Take 1 pain pill, hydrocodone, every 4 hours only if needed for pain.  AMBULATORY SURGERY  DISCHARGE INSTRUCTIONS   1) The drugs that you were given will stay in your system until tomorrow so for the next 24 hours you should not:  A) Drive an automobile B) Make any legal decisions C) Drink any alcoholic beverage   2) You may resume regular meals tomorrow.  Today it is better to start with liquids and gradually work up to solid foods.  You may eat anything you prefer, but it is better to start with liquids, then soup and crackers, and gradually work up to solid foods.   3) Please notify your doctor immediately if you have any unusual bleeding, trouble breathing, redness and pain at the surgery site, drainage, fever, or pain not relieved by medication.    4) Additional Instructions:        Please contact your physician with any problems or Same Day Surgery at [phone], Monday through Friday 6 am to 4 pm, or [place] at [facility] number at [phone].

## 2018-12-25 NOTE — Op Note (Signed)
Date of operation: 12/25/2018.  Surgeon: Durward Fortes D.P.M.  Preoperative diagnosis: Chronic plantar fasciitis left heel.  Postoperative diagnosis: Same.  Procedure: Partial plantar fasciectomy left heel.  Anesthesia: LMA.  Hemostasis: Pneumatic tourniquet left ankle 250 mmHg.  Estimated blood loss: Less than 5 cc.  Pathology: Plantar fascial soft tissue left heel.  Injectables: 17 cc 0.5% Marcaine plain.  Complications: None apparent.  Operative indications: This is a 58 year old male with chronic painful plantar fasciitis on the left heel.  Had endoscopic release about 20 years ago.  Conservative care is been unsuccessful and he elects for repeat surgical intervention.  Operative procedure: Patient was taken to the operating room and placed on the table in the supine position.  Following satisfactory LMA anesthesia a pneumatic tourniquet was applied at the level of the left ankle and the foot was prepped and draped in the usual sterile fashion.  The foot was exsanguinated and the tourniquet inflated to 250 mmHg.     Attention was then directed to the medial aspect of the left heel where an approximate 4 to 5 cm linear incision was made from proximal to distal.  The incision was deepened via sharp and blunt dissection down to the level of the fascia which was clearly identified.  A linear incision was made along the medial aspect and sharp and blunt dissection carried down to the heel and released from the underlying muscle belly.  The fascia was grasped using a Coker clamp and the fascia was released from the calcaneus using a Beaver blade and then an approximate 1 cm section was removed.  The wound was then flushed with copious amounts sterile saline and closed using 3-0 Vicryl simple interrupted sutures for deep closure followed by superficial closure using 4-0 Vicryl simple interrupted sutures.  Skin closure with 4-0 nylon simple interrupted sutures followed by 3 deep retention  vertical mattress sutures using 2-0 nylon.  17 cc of Marcaine was then injected for postoperative analgesia.  Xeroform 4 x 4's Kerlix applied to the left lower extremity and the tourniquet was released.  Blood flow noted return immediately to the left foot and all digits.  A second Kerlix was then applied followed by some cast padding and a fiberglass posterior splint.  Patient was awakened and transported the PACU with vital signs stable and in good condition.

## 2018-12-25 NOTE — Transfer of Care (Signed)
Immediate Anesthesia Transfer of Care Note  Patient: Jonathan Lester  Procedure(s) Performed: PLANTAR FASCIA RELEASE/28060 (Left )  Patient Location: PACU  Anesthesia Type:General  Level of Consciousness: drowsy  Airway & Oxygen Therapy: Patient Spontanous Breathing and Patient connected to face mask oxygen  Post-op Assessment: Report given to RN and Post -op Vital signs reviewed and stable  Post vital signs: stable  Last Vitals:  Vitals Value Taken Time  BP 143/87 12/25/18 0845  Temp 36.4 C 12/25/18 0845  Pulse 91 12/25/18 0847  Resp 16 12/25/18 0847  SpO2 100 % 12/25/18 0847  Vitals shown include unvalidated device data.  Last Pain:  Vitals:   12/25/18 0613  TempSrc: Tympanic         Complications: No apparent anesthesia complications

## 2018-12-25 NOTE — Anesthesia Preprocedure Evaluation (Signed)
Anesthesia Evaluation  Patient identified by MRN, date of birth, ID band Patient awake    Reviewed: Allergy & Precautions, NPO status , Patient's Chart, lab work & pertinent test results  History of Anesthesia Complications Negative for: history of anesthetic complications  Airway Mallampati: II  TM Distance: >3 FB Neck ROM: Full    Dental no notable dental hx.    Pulmonary sleep apnea and Continuous Positive Airway Pressure Ventilation , neg COPD,    breath sounds clear to auscultation- rhonchi (-) wheezing      Cardiovascular hypertension, Pt. on medications (-) CAD, (-) Past MI, (-) Cardiac Stents and (-) CABG  Rhythm:Regular Rate:Normal - Systolic murmurs and - Diastolic murmurs    Neuro/Psych neg Seizures negative neurological ROS  negative psych ROS   GI/Hepatic negative GI ROS, Neg liver ROS,   Endo/Other  negative endocrine ROSneg diabetes  Renal/GU negative Renal ROS     Musculoskeletal negative musculoskeletal ROS (+)   Abdominal (+) + obese,   Peds  Hematology negative hematology ROS (+)   Anesthesia Other Findings Past Medical History: No date: History of kidney stones No date: Hyperlipidemia No date: Hypertension No date: Sleep apnea   Reproductive/Obstetrics                             Anesthesia Physical Anesthesia Plan  ASA: II  Anesthesia Plan: General   Post-op Pain Management:    Induction: Intravenous  PONV Risk Score and Plan: 1 and Ondansetron and Midazolam  Airway Management Planned: LMA  Additional Equipment:   Intra-op Plan:   Post-operative Plan:   Informed Consent: I have reviewed the patients History and Physical, chart, labs and discussed the procedure including the risks, benefits and alternatives for the proposed anesthesia with the patient or authorized representative who has indicated his/her understanding and acceptance.     Dental  advisory given  Plan Discussed with: CRNA and Anesthesiologist  Anesthesia Plan Comments:         Anesthesia Quick Evaluation

## 2018-12-25 NOTE — Interval H&P Note (Signed)
History and Physical Interval Note:  12/25/2018 7:06 AM  Jonathan Lester  has presented today for surgery, with the diagnosis of M72.2, M77.9.  The various methods of treatment have been discussed with the patient and family. After consideration of risks, benefits and other options for treatment, the patient has consented to  Procedure(s): PLANTAR FASCIA RELEASE/28060 (Left) as a surgical intervention.  The patient's history has been reviewed, patient examined, no change in status, stable for surgery.  I have reviewed the patient's chart and labs.  Questions were answered to the patient's satisfaction.     Durward Fortes

## 2018-12-25 NOTE — Anesthesia Post-op Follow-up Note (Signed)
Anesthesia QCDR form completed.        

## 2018-12-25 NOTE — Anesthesia Postprocedure Evaluation (Signed)
Anesthesia Post Note  Patient: Jonathan Lester  Procedure(s) Performed: PLANTAR FASCIA RELEASE/28060 (Left )  Patient location during evaluation: PACU Anesthesia Type: General Level of consciousness: awake and alert and oriented Pain management: pain level controlled Vital Signs Assessment: post-procedure vital signs reviewed and stable Respiratory status: spontaneous breathing, nonlabored ventilation and respiratory function stable Cardiovascular status: blood pressure returned to baseline and stable Postop Assessment: no signs of nausea or vomiting Anesthetic complications: no     Last Vitals:  Vitals:   12/25/18 0914 12/25/18 0928  BP: 137/78 134/79  Pulse: 90 85  Resp: 13 18  Temp: 36.7 C 36.7 C  SpO2: 93% 95%    Last Pain:  Vitals:   12/25/18 0928  TempSrc: Temporal  PainSc: 3                  Breeanna Galgano

## 2018-12-25 NOTE — H&P (Signed)
Subjective: Patient presents for surgical correction for plantar fasciitis on his left heel.  Objective: Pulses are palpable on the left foot.  Neurologically intact.  Some mild edema noted on the plantar medial aspect of the left heel.  Exquisite pain noted on palpation of the proximal plantar fascial and calcaneal tubercle.  Assessment: Chronic plantar fasciitis left heel.  Plan: Medical history and physical in the chart was reviewed.  Patient is stable for partial plantar fasciectomy left heel.

## 2018-12-25 NOTE — Anesthesia Procedure Notes (Signed)
Procedure Name: LMA Insertion Date/Time: 12/25/2018 7:31 AM Performed by: Lavone Orn, CRNA Pre-anesthesia Checklist: Patient identified, Emergency Drugs available, Suction available, Patient being monitored and Timeout performed Patient Re-evaluated:Patient Re-evaluated prior to induction Oxygen Delivery Method: Circle system utilized Preoxygenation: Pre-oxygenation with 100% oxygen Induction Type: IV induction Ventilation: Mask ventilation without difficulty LMA: LMA inserted LMA Size: 5.0 Number of attempts: 1 Placement Confirmation: positive ETCO2 and breath sounds checked- equal and bilateral Tube secured with: Tape Dental Injury: Teeth and Oropharynx as per pre-operative assessment

## 2018-12-28 LAB — SURGICAL PATHOLOGY

## 2019-03-19 ENCOUNTER — Other Ambulatory Visit: Payer: Self-pay

## 2019-03-19 DIAGNOSIS — Z20822 Contact with and (suspected) exposure to covid-19: Secondary | ICD-10-CM

## 2019-03-21 LAB — NOVEL CORONAVIRUS, NAA: SARS-CoV-2, NAA: NOT DETECTED

## 2019-10-02 ENCOUNTER — Other Ambulatory Visit (HOSPITAL_COMMUNITY): Payer: Self-pay | Admitting: Nurse Practitioner

## 2019-10-02 ENCOUNTER — Other Ambulatory Visit: Payer: Self-pay | Admitting: Nurse Practitioner

## 2019-10-02 DIAGNOSIS — G8929 Other chronic pain: Secondary | ICD-10-CM

## 2019-10-02 DIAGNOSIS — M5416 Radiculopathy, lumbar region: Secondary | ICD-10-CM

## 2019-10-04 ENCOUNTER — Ambulatory Visit: Admission: RE | Admit: 2019-10-04 | Payer: BC Managed Care – PPO | Source: Ambulatory Visit

## 2019-10-04 ENCOUNTER — Other Ambulatory Visit: Payer: Self-pay

## 2019-10-04 ENCOUNTER — Ambulatory Visit
Admission: RE | Admit: 2019-10-04 | Discharge: 2019-10-04 | Disposition: A | Discharge status: Home / Self Care | Payer: BC Managed Care – PPO | Source: Ambulatory Visit | Attending: Nurse Practitioner | Admitting: Nurse Practitioner

## 2019-10-04 DIAGNOSIS — G8929 Other chronic pain: Secondary | ICD-10-CM | POA: Diagnosis present

## 2019-10-04 DIAGNOSIS — M5416 Radiculopathy, lumbar region: Secondary | ICD-10-CM

## 2019-10-04 DIAGNOSIS — M5441 Lumbago with sciatica, right side: Secondary | ICD-10-CM

## 2019-10-15 ENCOUNTER — Ambulatory Visit
Admission: RE | Admit: 2019-10-15 | Discharge: 2019-10-15 | Disposition: A | Discharge status: Home / Self Care | Payer: BC Managed Care – PPO | Attending: Nurse Practitioner | Admitting: Nurse Practitioner

## 2019-10-15 ENCOUNTER — Ambulatory Visit
Admission: RE | Admit: 2019-10-15 | Discharge: 2019-10-15 | Disposition: A | Discharge status: Home / Self Care | Payer: BC Managed Care – PPO | Source: Ambulatory Visit | Attending: Nurse Practitioner | Admitting: Nurse Practitioner

## 2019-10-15 ENCOUNTER — Other Ambulatory Visit: Payer: Self-pay | Admitting: Nurse Practitioner

## 2019-10-15 DIAGNOSIS — M7918 Myalgia, other site: Secondary | ICD-10-CM | POA: Diagnosis present

## 2020-01-27 ENCOUNTER — Ambulatory Visit
Payer: BC Managed Care – PPO | Attending: Student in an Organized Health Care Education/Training Program | Admitting: Student in an Organized Health Care Education/Training Program

## 2020-01-27 ENCOUNTER — Encounter: Payer: Self-pay | Admitting: Student in an Organized Health Care Education/Training Program

## 2020-01-27 ENCOUNTER — Other Ambulatory Visit: Payer: Self-pay

## 2020-01-27 VITALS — BP 133/86 | HR 88 | Temp 97.3°F | Resp 16 | Ht 67.0 in | Wt 206.0 lb

## 2020-01-27 DIAGNOSIS — M545 Low back pain, unspecified: Secondary | ICD-10-CM | POA: Diagnosis not present

## 2020-01-27 DIAGNOSIS — G894 Chronic pain syndrome: Secondary | ICD-10-CM | POA: Insufficient documentation

## 2020-01-27 DIAGNOSIS — G8929 Other chronic pain: Secondary | ICD-10-CM | POA: Insufficient documentation

## 2020-01-27 DIAGNOSIS — M51369 Other intervertebral disc degeneration, lumbar region without mention of lumbar back pain or lower extremity pain: Secondary | ICD-10-CM | POA: Insufficient documentation

## 2020-01-27 DIAGNOSIS — M5136 Other intervertebral disc degeneration, lumbar region: Secondary | ICD-10-CM | POA: Diagnosis present

## 2020-01-27 DIAGNOSIS — M47816 Spondylosis without myelopathy or radiculopathy, lumbar region: Secondary | ICD-10-CM | POA: Diagnosis present

## 2020-01-27 NOTE — Progress Notes (Signed)
Safety precautions to be maintained throughout the outpatient stay will include: orient to surroundings, keep bed in low position, maintain call bell within reach at all times, provide assistance with transfer out of bed and ambulation.  

## 2020-01-27 NOTE — Patient Instructions (Signed)
____________________________________________________________________________________________  Preparing for Procedure with Sedation  Procedure appointments are limited to planned procedures: . No Prescription Refills. . No disability issues will be discussed. . No medication changes will be discussed.  Instructions: . Oral Intake: Do not eat or drink anything for at least 8 hours prior to your procedure. (Exception: Blood Pressure Medication. See below.) . Transportation: Unless otherwise stated by your physician, you may drive yourself after the procedure. . Blood Pressure Medicine: Do not forget to take your blood pressure medicine with a sip of water the morning of the procedure. If your Diastolic (lower reading)is above 100 mmHg, elective cases will be cancelled/rescheduled. . Blood thinners: These will need to be stopped for procedures. Notify our staff if you are taking any blood thinners. Depending on which one you take, there will be specific instructions on how and when to stop it. . Diabetics on insulin: Notify the staff so that you can be scheduled 1st case in the morning. If your diabetes requires high dose insulin, take only  of your normal insulin dose the morning of the procedure and notify the staff that you have done so. . Preventing infections: Shower with an antibacterial soap the morning of your procedure. . Build-up your immune system: Take 1000 mg of Vitamin C with every meal (3 times a day) the day prior to your procedure. . Antibiotics: Inform the staff if you have a condition or reason that requires you to take antibiotics before dental procedures. . Pregnancy: If you are pregnant, call and cancel the procedure. . Sickness: If you have a cold, fever, or any active infections, call and cancel the procedure. . Arrival: You must be in the facility at least 30 minutes prior to your scheduled procedure. . Children: Do not bring children with you. . Dress appropriately:  Bring dark clothing that you would not mind if they get stained. . Valuables: Do not bring any jewelry or valuables.  Reasons to call and reschedule or cancel your procedure: (Following these recommendations will minimize the risk of a serious complication.) . Surgeries: Avoid having procedures within 2 weeks of any surgery. (Avoid for 2 weeks before or after any surgery). . Flu Shots: Avoid having procedures within 2 weeks of a flu shots or . (Avoid for 2 weeks before or after immunizations). . Barium: Avoid having a procedure within 7-10 days after having had a radiological study involving the use of radiological contrast. (Myelograms, Barium swallow or enema study). . Heart attacks: Avoid any elective procedures or surgeries for the initial 6 months after a "Myocardial Infarction" (Heart Attack). . Blood thinners: It is imperative that you stop these medications before procedures. Let us know if you if you take any blood thinner.  . Infection: Avoid procedures during or within two weeks of an infection (including chest colds or gastrointestinal problems). Symptoms associated with infections include: Localized redness, fever, chills, night sweats or profuse sweating, burning sensation when voiding, cough, congestion, stuffiness, runny nose, sore throat, diarrhea, nausea, vomiting, cold or Flu symptoms, recent or current infections. It is specially important if the infection is over the area that we intend to treat. . Heart and lung problems: Symptoms that may suggest an active cardiopulmonary problem include: cough, chest pain, breathing difficulties or shortness of breath, dizziness, ankle swelling, uncontrolled high or unusually low blood pressure, and/or palpitations. If you are experiencing any of these symptoms, cancel your procedure and contact your primary care physician for an evaluation.  Remember:  Regular Business hours are:    Monday to Thursday 8:00 AM to 4:00 PM  Provider's  Schedule: Milinda Pointer, MD:  Procedure days: Tuesday and Thursday 7:30 AM to 4:00 PM  Gillis Santa, MD:  Procedure days: Monday and Wednesday 7:30 AM to 4:00 PM ____________________________________________________________________________________________  Facet Blocks Patient Information  Description: The facets are joints in the spine between the vertebrae.  Like any joints in the body, facets can become irritated and painful.  Arthritis can also effect the facets.  By injecting steroids and local anesthetic in and around these joints, we can temporarily block the nerve supply to them.  Steroids act directly on irritated nerves and tissues to reduce selling and inflammation which often leads to decreased pain.  Facet blocks may be done anywhere along the spine from the neck to the low back depending upon the location of your pain.   After numbing the skin with local anesthetic (like Novocaine), a small needle is passed onto the facet joints under x-ray guidance.  You may experience a sensation of pressure while this is being done.  The entire block usually lasts about 15-25 minutes.   Conditions which may be treated by facet blocks:   Low back/buttock pain  Neck/shoulder pain  Certain types of headaches  Preparation for the injection:  1. Do not eat any solid food or dairy products within 8 hours of your appointment. 2. You may drink clear liquid up to 3 hours before appointment.  Clear liquids include water, black coffee, juice or soda.  No milk or cream please. 3. You may take your regular medication, including pain medications, with a sip of water before your appointment.  Diabetics should hold regular insulin (if taken separately) and take 1/2 normal NPH dose the morning of the procedure.  Carry some sugar containing items with you to your appointment. 4. A driver must accompany you and be prepared to drive you home after your procedure. 5. Bring all your current medications  with you. 6. An IV may be inserted and sedation may be given at the discretion of the physician. 7. A blood pressure cuff, EKG and other monitors will often be applied during the procedure.  Some patients may need to have extra oxygen administered for a short period. 8. You will be asked to provide medical information, including your allergies and medications, prior to the procedure.  We must know immediately if you are taking blood thinners (like Coumadin/Warfarin) or if you are allergic to IV iodine contrast (dye).  We must know if you could possible be pregnant.  Possible side-effects:   Bleeding from needle site  Infection (rare, may require surgery)  Nerve injury (rare)  Numbness & tingling (temporary)  Difficulty urinating (rare, temporary)  Spinal headache (a headache worse with upright posture)  Light-headedness (temporary)  Pain at injection site (serveral days)  Decreased blood pressure (rare, temporary)  Weakness in arm/leg (temporary)  Pressure sensation in back/neck (temporary)   Call if you experience:   Fever/chills associated with headache or increased back/neck pain  Headache worsened by an upright position  New onset, weakness or numbness of an extremity below the injection site  Hives or difficulty breathing (go to the emergency room)  Inflammation or drainage at the injection site(s)  Severe back/neck pain greater than usual  New symptoms which are concerning to you  Please note:  Although the local anesthetic injected can often make your back or neck feel good for several hours after the injection, the pain will likely return. It takes 3-7  days for steroids to work.  You may not notice any pain relief for at least one week.  If effective, we will often do a series of 2-3 injections spaced 3-6 weeks apart to maximally decrease your pain.  After the initial series, you may be a candidate for a more permanent nerve block of the facets.  If you  have any questions, please call [phone] [facility] 

## 2020-01-27 NOTE — Progress Notes (Signed)
Patient: Jonathan Lester  Service Category: E/M  Provider: Gillis Santa, MD  DOB: 1960-05-07  DOS: 01/27/2020  Referring Provider: Deetta Perla, MD  MRN: [id number]  Setting: Ambulatory outpatient  PCP: Albina Billet, MD  Type: New Patient  Specialty: Interventional Pain Management    Location: Office  Delivery: Face-to-face     Primary Reason(s) for Visit: Encounter for initial evaluation of one or more chronic problems (new to examiner) potentially causing chronic pain, and posing a threat to normal musculoskeletal function. (Level of risk: High) CC: Back Pain  HPI  Mr. Jonathan Lester is a 59 y.o. year old, male patient, who comes for the first time to our practice referred by Deetta Perla, MD for our initial evaluation of his chronic pain. He has Gastroesophageal reflux disease with esophagitis; Intestinal metaplasia of gastric mucosa; Lumbar facet arthropathy; Lumbar degenerative disc disease; Chronic bilateral low back pain without sciatica; and Chronic pain syndrome on their problem list. Today he comes in for evaluation of his Back Pain  Pain Assessment: Location: Lower, Right, Left Back Radiating: radiates into both hips and down right leg to the back of knee Onset: More than a month ago Duration: Chronic pain Quality: Numbness Severity: 3 /10 (subjective, self-reported pain score)  Effect on ADL: limits activities Timing: Constant Modifying factors: standing up BP: 133/86  HR: 88  Onset and Duration: Gradual Cause of pain: Unknown Severity: Getting worse, NAS-11 at its worse: 9/10, NAS-11 at its best: 3/10, NAS-11 now: 3/10 and NAS-11 on the average: 3/10 Timing: Not influenced by the time of the day Aggravating Factors: Motion, Prolonged sitting and bouncing Alleviating Factors: Standing Associated Problems: Numbness and Pain that wakes patient up Quality of Pain: Intermittent, Sharp and Stabbing Previous Examinations or Tests: MRI scan and X-rays Previous Treatments: Epidural  steroid injections and Steroid treatments by mouth  Jonathan Lester is a pleasant 59 year old male who presents with a chief complaint of axial low back pain with intermittent radiation into bilateral hips and occasionally down to his right posterior lateral thigh.  He also endorses numbness in his right posterior lateral thigh.  He is being referred here from Dr. Lacinda Axon.  Of note patient has had 4 epidural steroid injections, transforaminal, at right L4-L5 without any significant benefit.  Patient has been doing home physical therapy regimen over the last 6 months.  He has a lumbar MRI done in June that shows lumbar facet arthropathy at L3, L4, L5 along with disc bulging at L3-L4 along with lumbar degenerative disc disease.  He is on multiple medications as below.  He has pain improvement when he bends forward and increased pain when he extends his lumbar spine.   Meds   Current Outpatient Medications:  .  celecoxib (CELEBREX) 100 MG capsule, TAKE 1 CAPSULE(100 MG) BY MOUTH TWICE DAILY, Disp: , Rfl:  .  cetirizine (ZYRTEC) 10 MG tablet, Take 10 mg by mouth at bedtime., Disp: , Rfl:  .  diclofenac sodium (VOLTAREN) 1 % GEL, Apply 1 application topically 4 (four) times daily as needed (pain)., Disp: , Rfl:  .  EMGALITY 120 MG/ML SOAJ, Inject 1 mL into the skin every 30 (thirty) days., Disp: , Rfl:  .  fenofibrate 160 MG tablet, Take 160 mg by mouth at bedtime. , Disp: , Rfl:  .  gabapentin (NEURONTIN) 300 MG capsule, Take by mouth., Disp: , Rfl:  .  irbesartan (AVAPRO) 300 MG tablet, Take 300 mg by mouth at bedtime. , Disp: , Rfl:  .  Multiple  Vitamin (MULTIVITAMIN WITH MINERALS) TABS tablet, Take 1 tablet by mouth at bedtime., Disp: , Rfl:  .  nortriptyline (PAMELOR) 10 MG capsule, TAKE 1 CAPSULE BY MOUTH AT NIGHT FOR 3 DAYS THEN INCREASE TO 2 CAPSULES AT NIGHT FOR 3 DAYS THEN 3 CAPSULES AT NIGHT AND CONTINUE, Disp: , Rfl:  .  nortriptyline (PAMELOR) 50 MG capsule, Take 50 mg by mouth at bedtime., Disp: , Rfl:   .  omeprazole (PRILOSEC) 40 MG capsule, Take 40 mg by mouth daily., Disp: , Rfl:  .  oxymetazoline (AFRIN) 0.05 % nasal spray, Place 1 spray into both nostrils 2 (two) times daily as needed for congestion., Disp: , Rfl:  .  pantoprazole (PROTONIX) 40 MG tablet, Take 40 mg by mouth at bedtime., Disp: , Rfl:  .  simvastatin (ZOCOR) 40 MG tablet, Take 40 mg by mouth at bedtime. , Disp: , Rfl:  .  tamsulosin (FLOMAX) 0.4 MG CAPS capsule, Take 1 capsule (0.4 mg total) by mouth daily. (Patient taking differently: Take 0.4 mg by mouth at bedtime. ), Disp: 14 capsule, Rfl: 0 .  tiZANidine (ZANAFLEX) 4 MG tablet, Take 4 mg by mouth 2 (two) times daily., Disp: , Rfl:  .  venlafaxine (EFFEXOR) 75 MG tablet, Take by mouth., Disp: , Rfl:  .  aspirin-acetaminophen-caffeine (EXCEDRIN MIGRAINE) 250-250-65 MG tablet, Take 2 tablets by mouth every 6 (six) hours as needed for headache. (Patient not taking: Reported on 01/27/2020), Disp: , Rfl:  .  ibuprofen (ADVIL) 200 MG tablet, Take 400-600 mg by mouth every 6 (six) hours as needed for headache or moderate pain., Disp: , Rfl:  .  meloxicam (MOBIC) 7.5 MG tablet, Take 7.5 mg by mouth at bedtime.  (Patient not taking: Reported on 01/27/2020), Disp: , Rfl:  .  predniSONE (STERAPRED UNI-PAK 21 TAB) 10 MG (21) TBPK tablet, Take 10-60 mg by mouth See admin instructions. (Typical regimens for 21 tablet dose packs of Methylprednisolone 55m, Prednisone 543m and Prednisone 1053mDay 1: 2 tabs before breakfast, 1 tab after lunch, 1 tab after supper, and 2 tabs at bedtime. Day 2: 1 tab before breakfast, 1 tab after lunch, 1 tab after supper, and 2 tabs at bedtime. Day 3: 1 tab before breakfast, 1 tab after lunch, 1 tab after supper, and 1 tab at bedtime. Day 4: 1 tab before breakfast, 1 tab after lunch, and 1 tab at bedtime. Day 5: 1 tab before breakfast and 1 tab at bedtime. Day 6: 1 tab before breakfast. (Patient not taking: Reported on 01/27/2020), Disp: , Rfl:   Imaging Review   Lumbosacral Imaging: Lumbar MR wo contrast: Results for orders placed during the hospital encounter of 10/04/19  MR LUMBAR SPINE WO CONTRAST  Narrative CLINICAL DATA:  Chronic low back, left leg tingling, right leg numbness  EXAM: MRI LUMBAR SPINE WITHOUT CONTRAST  TECHNIQUE: Multiplanar, multisequence MR imaging of the lumbar spine was performed. No intravenous contrast was administered.  COMPARISON:  Images from 11/25/2018 study are unavailable  FINDINGS: Segmentation:  Standard.  Alignment:  Stable.  No significant listhesis.  Vertebrae: Stable vertebral body heights. There is no significant marrow edema or suspicious osseous lesion.  Conus medullaris and cauda equina: Conus extends to the T12-L1 level. Conus and cauda equina appear normal.  Paraspinal and other soft tissues: Unremarkable.  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  L1-L2:  No significant canal or foraminal stenosis.  L2-L3:  No significant canal or foraminal stenosis.  L3-L4: Disc bulge and  mild facet arthropathy. Mild canal stenosis with partial effacement lateral recesses. Minor foraminal stenosis.  L4-L5: Disc bulge and mild facet arthropathy. No significant canal or right foraminal stenosis. Mild left foraminal stenosis.  L5-S1:  No significant canal or foraminal stenosis.  IMPRESSION: Mild multilevel degenerative changes, greatest at L3-L4.   Electronically Signed By: Macy Mis M.D. On: 10/05/2019 09:48  Narrative CLINICAL DATA:  Diffuse mild fascial pain syndrome, no bulging disc, left lower extremity paresthesias and numbness  EXAM: DG HIP (WITH OR WITHOUT PELVIS) 2-3V LEFT  COMPARISON:  None.  FINDINGS: Bones of the pelvis are intact and congruent. The proximal femora are intact and normally located. Minimal degenerative changes in the lower lumbar spine, hips and pelvis. Normal bone mineralization. No worrisome osseous lesions. No acute or  suspicious soft tissue abnormality. Few phleboliths in the pelvis.  IMPRESSION: No acute osseous abnormality.  Minimal degenerative changes in the lower lumbar spine, hips and pelvis.   Electronically Signed By: Lovena Le M.D. On: 10/15/2019 21:00  Complexity Note: Imaging results reviewed. Results shared with Mr. Aungst, using Layman's terms.                         ROS  Cardiovascular: High blood pressure Pulmonary or Respiratory: No reported pulmonary signs or symptoms such as wheezing and difficulty taking a deep full breath (Asthma), difficulty blowing air out (Emphysema), coughing up mucus (Bronchitis), persistent dry cough, or temporary stoppage of breathing during sleep Neurological: No reported neurological signs or symptoms such as seizures, abnormal skin sensations, urinary and/or fecal incontinence, being born with an abnormal open spine and/or a tethered spinal cord Psychological-Psychiatric: No reported psychological or psychiatric signs or symptoms such as difficulty sleeping, anxiety, depression, delusions or hallucinations (schizophrenial), mood swings (bipolar disorders) or suicidal ideations or attempts Gastrointestinal: No reported gastrointestinal signs or symptoms such as vomiting or evacuating blood, reflux, heartburn, alternating episodes of diarrhea and constipation, inflamed or scarred liver, or pancreas or irrregular and/or infrequent bowel movements Genitourinary: No reported renal or genitourinary signs or symptoms such as difficulty voiding or producing urine, peeing blood, non-functioning kidney, kidney stones, difficulty emptying the bladder, difficulty controlling the flow of urine, or chronic kidney disease Hematological: No reported hematological signs or symptoms such as prolonged bleeding, low or poor functioning platelets, bruising or bleeding easily, hereditary bleeding problems, low energy levels due to low hemoglobin or being anemic Endocrine: No  reported endocrine signs or symptoms such as high or low blood sugar, rapid heart rate due to high thyroid levels, obesity or weight gain due to slow thyroid or thyroid disease Rheumatologic: No reported rheumatological signs and symptoms such as fatigue, joint pain, tenderness, swelling, redness, heat, stiffness, decreased range of motion, with or without associated rash Musculoskeletal: Negative for myasthenia gravis, muscular dystrophy, multiple sclerosis or malignant hyperthermia Work History: Working full time  Allergies  Mr. Chevez is allergic to codeine.  Laboratory Chemistry Profile   Renal Lab Results  Component Value Date   BUN 27 (H) 01/23/2017   CREATININE 1.18 01/23/2017   GFRAA >60 01/23/2017   GFRNONAA >60 01/23/2017   PROTEINUR NEGATIVE 01/23/2017     Electrolytes Lab Results  Component Value Date   NA 138 01/23/2017   K 3.7 01/23/2017   CL 104 01/23/2017   CALCIUM 9.2 01/23/2017     Hepatic Lab Results  Component Value Date   AST 22 01/23/2017   ALT 27 01/23/2017   ALBUMIN 4.9 01/23/2017  ALKPHOS 33 (L) 01/23/2017   LIPASE 19 01/23/2017     ID Lab Results  Component Value Date   SARSCOV2NAA Not Detected 03/19/2019     Bone No results found for: VD25OH, JG811XB2IOM, BT5974BU3, AG5364WO0, 25OHVITD1, 25OHVITD2, 25OHVITD3, TESTOFREE, TESTOSTERONE   Endocrine Lab Results  Component Value Date   GLUCOSE 115 (H) 01/23/2017   GLUCOSEU NEGATIVE 01/23/2017     Neuropathy No results found for: VITAMINB12, FOLATE, HGBA1C, HIV   CNS No results found for: COLORCSF, APPEARCSF, RBCCOUNTCSF, WBCCSF, POLYSCSF, LYMPHSCSF, EOSCSF, PROTEINCSF, GLUCCSF, JCVIRUS, CSFOLI, IGGCSF, LABACHR, ACETBL, LABACHR, ACETBL   Inflammation (CRP: Acute  ESR: Chronic) No results found for: CRP, ESRSEDRATE, LATICACIDVEN   Rheumatology No results found for: RF, ANA, LABURIC, URICUR, LYMEIGGIGMAB, LYMEABIGMQN, HLAB27   Coagulation Lab Results  Component Value Date   PLT 288  01/23/2017     Cardiovascular Lab Results  Component Value Date   HGB 13.6 01/23/2017   HCT 39.7 (L) 01/23/2017     Screening Lab Results  Component Value Date   SARSCOV2NAA Not Detected 03/19/2019     Cancer No results found for: CEA, CA125, LABCA2   Allergens No results found for: ALMOND, APPLE, ASPARAGUS, AVOCADO, BANANA, BARLEY, BASIL, BAYLEAF, GREENBEAN, LIMABEAN, WHITEBEAN, BEEFIGE, REDBEET, BLUEBERRY, BROCCOLI, CABBAGE, MELON, CARROT, CASEIN, CASHEWNUT, CAULIFLOWER, CELERY     Note: Lab results reviewed.  PFSH  Drug: Mr. Febles  reports no history of drug use. Alcohol:  reports current alcohol use. Tobacco:  reports that he has never smoked. He has never used smokeless tobacco. Medical:  has a past medical history of History of kidney stones, Hyperlipidemia, Hypertension, and Sleep apnea. Family: family history is not on file.  Past Surgical History:  Procedure Laterality Date  . CHOLECYSTECTOMY    . PLANTAR FASCIA RELEASE Left   . PLANTAR FASCIA RELEASE Left 12/25/2018   Procedure: PLANTAR FASCIA RELEASE/[id number];  Surgeon: Sharlotte Alamo, DPM;  Location: [facility];  Service: Podiatry;  Laterality: Left;  . ROTATOR CUFF REPAIR Bilateral   . TONSILLECTOMY     Active Ambulatory Problems    Diagnosis Date Noted  . Gastroesophageal reflux disease with esophagitis 05/28/2014  . Intestinal metaplasia of gastric mucosa 05/28/2014  . Lumbar facet arthropathy 01/27/2020  . Lumbar degenerative disc disease 01/27/2020  . Chronic bilateral low back pain without sciatica 01/27/2020  . Chronic pain syndrome 01/27/2020   Resolved Ambulatory Problems    Diagnosis Date Noted  . No Resolved Ambulatory Problems   Past Medical History:  Diagnosis Date  . History of kidney stones   . Hyperlipidemia   . Hypertension   . Sleep apnea    Constitutional Exam  General appearance: Well nourished, well developed, and well hydrated. In no apparent acute distress Vitals:   01/27/20 1403   BP: 133/86  Pulse: 88  Resp: 16  Temp: (!) 97.3 F (36.3 C)  SpO2: 99%  Weight: 206 lb (93.4 kg)  Height: 5' 7"  (1.702 m)   BMI Assessment: Estimated body mass index is 32.26 kg/m as calculated from the following:   Height as of this encounter: 5' 7"  (1.702 m).   Weight as of this encounter: 206 lb (93.4 kg).  BMI interpretation table: BMI level Category Range association with higher incidence of chronic pain  <18 kg/m2 Underweight   18.5-24.9 kg/m2 Ideal body weight   25-29.9 kg/m2 Overweight Increased incidence by 20%  30-34.9 kg/m2 Obese (Class I) Increased incidence by 68%  35-39.9 kg/m2 Severe obesity (Class II) Increased incidence by 136%  >  40 kg/m2 Extreme obesity (Class III) Increased incidence by 254%   Patient's current BMI Ideal Body weight  Body mass index is 32.26 kg/m. Ideal body weight: 66.1 kg (145 lb 11.6 oz) Adjusted ideal body weight: 77 kg (169 lb 13.4 oz)   BMI Readings from Last 4 Encounters:  01/27/20 32.26 kg/m  12/25/18 34.18 kg/m  12/22/18 34.28 kg/m   Wt Readings from Last 4 Encounters:  01/27/20 206 lb (93.4 kg)  12/25/18 218 lb 4.1 oz (99 kg)  12/22/18 218 lb 14.4 oz (99.3 kg)    Psych/Mental status: Alert, oriented x 3 (person, place, & time)       Eyes: PERLA Respiratory: No evidence of acute respiratory distress  Cervical Spine Exam  Skin & Axial Inspection: No masses, redness, edema, swelling, or associated skin lesions Alignment: Symmetrical Functional ROM: Unrestricted ROM      Stability: No instability detected Muscle Tone/Strength: Functionally intact. No obvious neuro-muscular anomalies detected. Sensory (Neurological): Unimpaired Palpation: No palpable anomalies              Upper Extremity (UE) Exam    Side: Right upper extremity  Side: Left upper extremity  Skin & Extremity Inspection: Skin color, temperature, and hair growth are WNL. No peripheral edema or cyanosis. No masses, redness, swelling, asymmetry, or  associated skin lesions. No contractures.  Skin & Extremity Inspection: Skin color, temperature, and hair growth are WNL. No peripheral edema or cyanosis. No masses, redness, swelling, asymmetry, or associated skin lesions. No contractures.  Functional ROM: Unrestricted ROM          Functional ROM: Unrestricted ROM          Muscle Tone/Strength: Functionally intact. No obvious neuro-muscular anomalies detected.   Muscle Tone/Strength: Functionally intact. No obvious neuro-muscular anomalies detected.  Sensory (Neurological): Unimpaired          Sensory (Neurological): Unimpaired          Palpation: No palpable anomalies              Palpation: No palpable anomalies              Provocative Test(s):  Phalen's test: deferred Tinel's test: deferred Apley's scratch test (touch opposite shoulder):  Action 1 (Across chest): deferred Action 2 (Overhead): deferred Action 3 (LB reach): deferred   Provocative Test(s):  Phalen's test: deferred Tinel's test: deferred Apley's scratch test (touch opposite shoulder):  Action 1 (Across chest): deferred Action 2 (Overhead): deferred Action 3 (LB reach): deferred    Thoracic Spine Area Exam  Skin & Axial Inspection: No masses, redness, or swelling Alignment: Symmetrical Functional ROM: Unrestricted ROM Stability: No instability detected Muscle Tone/Strength: Functionally intact. No obvious neuro-muscular anomalies detected. Sensory (Neurological): Unimpaired Muscle strength & Tone: No palpable anomalies  Lumbar Exam  Skin & Axial Inspection: No masses, redness, or swelling Alignment: Symmetrical Functional ROM: Pain restricted ROM       Stability: No instability detected Muscle Tone/Strength: Functionally intact. No obvious neuro-muscular anomalies detected. Sensory (Neurological): Musculoskeletal pain pattern Palpation: No palpable anomalies       Provocative Tests: Hyperextension/rotation test: (+) bilaterally for facet joint pain. Lumbar  quadrant test (Kemp's test): (+) bilaterally for facet joint pain. Lateral bending test: (+) due to pain. Patrick's Maneuver: deferred today                   FABER* test: deferred today  S-I anterior distraction/compression test: deferred today         S-I lateral compression test: deferred today         S-I Thigh-thrust test: deferred today         S-I Gaenslen's test: deferred today         *(Flexion, ABduction and External Rotation)  Gait & Posture Assessment  Ambulation: Unassisted Gait: Relatively normal for age and body habitus Posture: WNL   Lower Extremity Exam    Side: Right lower extremity  Side: Left lower extremity  Stability: No instability observed          Stability: No instability observed          Skin & Extremity Inspection: Skin color, temperature, and hair growth are WNL. No peripheral edema or cyanosis. No masses, redness, swelling, asymmetry, or associated skin lesions. No contractures.  Skin & Extremity Inspection: Skin color, temperature, and hair growth are WNL. No peripheral edema or cyanosis. No masses, redness, swelling, asymmetry, or associated skin lesions. No contractures.  Functional ROM: Unrestricted ROM                  Functional ROM: Unrestricted ROM                  Muscle Tone/Strength: Functionally intact. No obvious neuro-muscular anomalies detected.  Muscle Tone/Strength: Functionally intact. No obvious neuro-muscular anomalies detected.  Sensory (Neurological): Unimpaired        Sensory (Neurological): Unimpaired        DTR: Patellar: deferred today Achilles: deferred today Plantar: deferred today  DTR: Patellar: deferred today Achilles: deferred today Plantar: deferred today  Palpation: No palpable anomalies  Palpation: No palpable anomalies   Assessment  Primary Diagnosis & Pertinent Problem List: The primary encounter diagnosis was Lumbar facet arthropathy. Diagnoses of Lumbar degenerative disc disease, Chronic  bilateral low back pain without sciatica, and Chronic pain syndrome were also pertinent to this visit.  Visit Diagnosis (New problems to examiner): 1. Lumbar facet arthropathy   2. Lumbar degenerative disc disease   3. Chronic bilateral low back pain without sciatica   4. Chronic pain syndrome    JONDAVID SCHREIER has a history of greater than 3 months of moderate to severe pain which is resulted in functional impairment.  The patient has tried various conservative therapeutic options such as NSAIDs, Tylenol, muscle relaxants, physical therapy which was inadequately effective.  Patient's pain is predominantly axial with physical exam and L-MRI findings suggestive of facet arthropathy. Lumbar facet medial branch nerve blocks were discussed with the patient.  Risks and benefits were reviewed.  Patient would like to proceed with bilateral L3, L4, L5 medial branch nerve block.   Plan of Care (Initial workup plan)   Procedure Orders     LUMBAR FACET(MEDIAL BRANCH NERVE BLOCK) MBNB   Provider-requested follow-up: Return in about 2 weeks (around 02/10/2020) for B/L L3, 4, 5 Fcts, with sedation.  Future Appointments  Date Time Provider [place]  02/21/2020  9:00 AM Gillis Santa, MD [facility] None    Note by: Gillis Santa, MD Date: 01/27/2020; Time: 2:49 PM

## 2020-02-21 ENCOUNTER — Ambulatory Visit (HOSPITAL_BASED_OUTPATIENT_CLINIC_OR_DEPARTMENT_OTHER): Payer: BC Managed Care – PPO | Admitting: Student in an Organized Health Care Education/Training Program

## 2020-02-21 ENCOUNTER — Other Ambulatory Visit: Payer: Self-pay

## 2020-02-21 ENCOUNTER — Encounter: Payer: Self-pay | Admitting: Student in an Organized Health Care Education/Training Program

## 2020-02-21 ENCOUNTER — Ambulatory Visit
Admission: RE | Admit: 2020-02-21 | Discharge: 2020-02-21 | Disposition: A | Discharge status: Home / Self Care | Payer: BC Managed Care – PPO | Source: Ambulatory Visit | Attending: Student in an Organized Health Care Education/Training Program | Admitting: Student in an Organized Health Care Education/Training Program

## 2020-02-21 VITALS — BP 123/80 | HR 83 | Temp 98.2°F | Resp 16 | Ht 67.0 in | Wt 205.0 lb

## 2020-02-21 DIAGNOSIS — M47816 Spondylosis without myelopathy or radiculopathy, lumbar region: Secondary | ICD-10-CM

## 2020-02-21 DIAGNOSIS — G894 Chronic pain syndrome: Secondary | ICD-10-CM

## 2020-02-21 DIAGNOSIS — M545 Low back pain, unspecified: Secondary | ICD-10-CM | POA: Insufficient documentation

## 2020-02-21 DIAGNOSIS — M5136 Other intervertebral disc degeneration, lumbar region: Secondary | ICD-10-CM

## 2020-02-21 DIAGNOSIS — G8929 Other chronic pain: Secondary | ICD-10-CM

## 2020-02-21 MED ORDER — LIDOCAINE HCL 2 % IJ SOLN
20.0000 mL | Freq: Once | INTRAMUSCULAR | Status: AC
  Administered 2020-02-21: 400 mg
  Filled 2020-02-21: qty 20

## 2020-02-21 MED ORDER — ROPIVACAINE HCL 2 MG/ML IJ SOLN
9.0000 mL | Freq: Once | INTRAMUSCULAR | Status: AC
  Administered 2020-02-21: 10 mL via PERINEURAL
  Filled 2020-02-21: qty 10

## 2020-02-21 MED ORDER — FENTANYL CITRATE (PF) 100 MCG/2ML IJ SOLN
25.0000 ug | INTRAMUSCULAR | Status: DC | PRN
  Administered 2020-02-21: 100 ug via INTRAVENOUS
  Filled 2020-02-21: qty 2

## 2020-02-21 MED ORDER — DEXAMETHASONE SODIUM PHOSPHATE 10 MG/ML IJ SOLN
10.0000 mg | Freq: Once | INTRAMUSCULAR | Status: AC
  Administered 2020-02-21: 10 mg
  Filled 2020-02-21: qty 1

## 2020-02-21 NOTE — Patient Instructions (Signed)

## 2020-02-21 NOTE — Progress Notes (Signed)
PROVIDER NOTE: Information contained herein reflects review and annotations entered in association with encounter. Interpretation of such information and data should be left to medically-trained personnel. Information provided to patient can be located elsewhere in the medical record under "Patient Instructions". Document created using STT-dictation technology, any transcriptional errors that may result from process are unintentional.    Patient: Jonathan Lester  Service Category: Procedure  Provider: Edward Jolly, MD  DOB: 18-Nov-1960  DOS: 02/21/2020  Location: [facility]  MRN: [id number]  Setting: Ambulatory - outpatient  Referring Provider: Jaclyn Shaggy, MD  Type: Established Patient  Specialty: Interventional Pain Management  PCP: Jaclyn Shaggy, MD   Primary Reason for Visit: Interventional Pain Management Treatment. CC: low back pain  Procedure:          Anesthesia, Analgesia, Anxiolysis:  Type: Lumbar Facet, Medial Branch Block(s) #1  Primary Purpose: Diagnostic Region: Posterolateral Lumbosacral Spine Level:  L3, L4, L5, Medial Branch Level(s). Injecting these levels blocks the L3-4, L4-5,  lumbar facet joints. Laterality: Bilateral  Type: Moderate (Conscious) Sedation combined with Local Anesthesia Indication(s): Analgesia and Anxiety Route: Intravenous (IV) IV Access: Secured Sedation: Meaningful verbal contact was maintained at all times during the procedure  Local Anesthetic: Lidocaine 1-2%  Position: Prone   Indications: 1. Lumbar facet arthropathy   2. Lumbar degenerative disc disease   3. Chronic bilateral low back pain without sciatica   4. Chronic pain syndrome    Pain Score: Pre-procedure: 5 /10 Post-procedure: 1 /10   Pre-op Assessment:  Mr. Steel is a 59 y.o. (year old), male patient, seen today for interventional treatment. He  has a past surgical history that includes Cholecystectomy; Plantar fascia release (Left); Rotator cuff repair  (Bilateral); Tonsillectomy; and Plantar fascia release (Left, 12/25/2018). Mr. Carrico has a current medication list which includes the following prescription(s): aspirin-acetaminophen-caffeine, celecoxib, cetirizine, diclofenac sodium, emgality, fenofibrate, gabapentin, irbesartan, meloxicam, multivitamin with minerals, nortriptyline, nortriptyline, omeprazole, oxymetazoline, pantoprazole, simvastatin, tamsulosin, tizanidine, venlafaxine, ibuprofen, and prednisone, and the following Facility-Administered Medications: fentanyl. His primarily concern today is the No chief complaint on file.  Initial Vital Signs:  Pulse/HCG Rate: 83ECG Heart Rate: 88 Temp: 98.5 F (36.9 C) Resp: 18 BP: 122/89 SpO2: 99 %  BMI: Estimated body mass index is 32.11 kg/m as calculated from the following:   Height as of this encounter: 5\' 7"  (1.702 m).   Weight as of this encounter: 205 lb (93 kg).  Risk Assessment: Allergies: Reviewed. He is allergic to codeine.  Allergy Precautions: None required Coagulopathies: Reviewed. None identified.  Blood-thinner therapy: None at this time Active Infection(s): Reviewed. None identified. Mr. Stefanko is afebrile  Site Confirmation: Mr. Willis was asked to confirm the procedure and laterality before marking the site Procedure checklist: Completed Consent: Before the procedure and under the influence of no sedative(s), amnesic(s), or anxiolytics, the patient was informed of the treatment options, risks and possible complications. To fulfill our ethical and legal obligations, as recommended by the American Medical Association's Code of Ethics, I have informed the patient of my clinical impression; the nature and purpose of the treatment or procedure; the risks, benefits, and possible complications of the intervention; the alternatives, including doing nothing; the risk(s) and benefit(s) of the alternative treatment(s) or procedure(s); and the risk(s) and benefit(s) of doing  nothing. The patient was provided information about the general risks and possible complications associated with the procedure. These may include, but are not limited to: failure to achieve desired goals, infection, bleeding, organ or  nerve damage, allergic reactions, paralysis, and death. In addition, the patient was informed of those risks and complications associated to Spine-related procedures, such as failure to decrease pain; infection (i.e.: Meningitis, epidural or intraspinal abscess); bleeding (i.e.: epidural hematoma, subarachnoid hemorrhage, or any other type of intraspinal or peri-dural bleeding); organ or nerve damage (i.e.: Any type of peripheral nerve, nerve root, or spinal cord injury) with subsequent damage to sensory, motor, and/or autonomic systems, resulting in permanent pain, numbness, and/or weakness of one or several areas of the body; allergic reactions; (i.e.: anaphylactic reaction); and/or death. Furthermore, the patient was informed of those risks and complications associated with the medications. These include, but are not limited to: allergic reactions (i.e.: anaphylactic or anaphylactoid reaction(s)); adrenal axis suppression; blood sugar elevation that in diabetics may result in ketoacidosis or comma; water retention that in patients with history of congestive heart failure may result in shortness of breath, pulmonary edema, and decompensation with resultant heart failure; weight gain; swelling or edema; medication-induced neural toxicity; particulate matter embolism and blood vessel occlusion with resultant organ, and/or nervous system infarction; and/or aseptic necrosis of one or more joints. Finally, the patient was informed that Medicine is not an exact science; therefore, there is also the possibility of unforeseen or unpredictable risks and/or possible complications that may result in a catastrophic outcome. The patient indicated having understood very clearly. We have given  the patient no guarantees and we have made no promises. Enough time was given to the patient to ask questions, all of which were answered to the patient's satisfaction. Mr. Nannini has indicated that he wanted to continue with the procedure. Attestation: I, the ordering provider, attest that I have discussed with the patient the benefits, risks, side-effects, alternatives, likelihood of achieving goals, and potential problems during recovery for the procedure that I have provided informed consent. Date   Time: 02/21/2020  8:30 AM  Pre-Procedure Preparation:  Monitoring: As per clinic protocol. Respiration, ETCO2, SpO2, BP, heart rate and rhythm monitor placed and checked for adequate function Safety Precautions: Patient was assessed for positional comfort and pressure points before starting the procedure. Time-out: I initiated and conducted the "Time-out" before starting the procedure, as per protocol. The patient was asked to participate by confirming the accuracy of the "Time Out" information. Verification of the correct person, site, and procedure were performed and confirmed by me, the nursing staff, and the patient. "Time-out" conducted as per Joint Commission's Universal Protocol (UP.01.01.01). Time: 0905  Description of Procedure:          Laterality: Bilateral. The procedure was performed in identical fashion on both sides. Levels:  L3, L4, L5 Medial Branch Level(s) Area Prepped: Posterior Lumbosacral Region DuraPrep (Iodine Povacrylex [0.7% available iodine] and Isopropyl Alcohol, 74% w/w) Safety Precautions: Aspiration looking for blood return was conducted prior to all injections. At no point did we inject any substances, as a needle was being advanced. Before injecting, the patient was told to immediately notify me if he was experiencing any new onset of "ringing in the ears, or metallic taste in the mouth". No attempts were made at seeking any paresthesias. Safe injection practices and  needle disposal techniques used. Medications properly checked for expiration dates. SDV (single dose vial) medications used. After the completion of the procedure, all disposable equipment used was discarded in the proper designated medical waste containers. Local Anesthesia: Protocol guidelines were followed. The patient was positioned over the fluoroscopy table. The area was prepped in the usual manner. The time-out was  completed. The target area was identified using fluoroscopy. A 12-in long, straight, sterile hemostat was used with fluoroscopic guidance to locate the targets for each level blocked. Once located, the skin was marked with an approved surgical skin marker. Once all sites were marked, the skin (epidermis, dermis, and hypodermis), as well as deeper tissues (fat, connective tissue and muscle) were infiltrated with a small amount of a short-acting local anesthetic, loaded on a 10cc syringe with a 25G, 1.5-in  Needle. An appropriate amount of time was allowed for local anesthetics to take effect before proceeding to the next step. Local Anesthetic: Lidocaine 2.0% The unused portion of the local anesthetic was discarded in the proper designated containers. Technical explanation of process:   L3 Medial Branch Nerve Block (MBB): The target area for the L3 medial branch is at the junction of the postero-lateral aspect of the superior articular process and the superior, posterior, and medial edge of the transverse process of L4. Under fluoroscopic guidance, a Quincke needle was inserted until contact was made with os over the superior postero-lateral aspect of the pedicular shadow (target area). After negative aspiration for blood, 1.555mL of the nerve block solution was injected without difficulty or complication. The needle was removed intact. L4 Medial Branch Nerve Block (MBB): The target area for the L4 medial branch is at the junction of the postero-lateral aspect of the superior articular process  and the superior, posterior, and medial edge of the transverse process of L5. Under fluoroscopic guidance, a Quincke needle was inserted until contact was made with os over the superior postero-lateral aspect of the pedicular shadow (target area). After negative aspiration for blood, 1.5  mL of the nerve block solution was injected without difficulty or complication. The needle was removed intact. L5 Medial Branch Nerve Block (MBB): The target area for the L5 medial branch is at the junction of the postero-lateral aspect of the superior articular process and the superior, posterior, and medial edge of the sacral ala. Under fluoroscopic guidance, a Quincke needle was inserted until contact was made with os over the superior postero-lateral aspect of the pedicular shadow (target area). After negative aspiration for blood, 1.5 mL of the nerve block solution was injected without difficulty or complication. The needle was removed intact.   Nerve block solution: 10 cc solution made of 8 cc of 0.2% Ropivacaine, 2 cc of Decadron 10 mg/cc.  1.5 cc injected at each level above bilaterally.  The unused portion of the solution was discarded in the proper designated containers. Procedural Needles: 22-gauge, 3.5-inch, Quincke needles used for all levels.  Once the entire procedure was completed, the treated area was cleaned, making sure to leave some of the prepping solution back to take advantage of its long term bactericidal properties.   Illustration of the posterior view of the lumbar spine and the posterior neural structures. Laminae of L2 through S1 are labeled. DPRL5, dorsal primary ramus of L5; DPRS1, dorsal primary ramus of S1; DPR3, dorsal primary ramus of L3; FJ, facet (zygapophyseal) joint L3-L4; I, inferior articular process of L4; LB1, lateral branch of dorsal primary ramus of L1; IAB, inferior articular branches from L3 medial branch (supplies L4-L5 facet joint); IBP, intermediate branch plexus; MB3,  medial branch of dorsal primary ramus of L3; NR3, third lumbar nerve root; S, superior articular process of L5; SAB, superior articular branches from L4 (supplies L4-5 facet joint also); TP3, transverse process of L3.  Vitals:   02/21/20 [id number] 02/21/20 [id number] 02/21/20 [id number] 02/21/20 [id number]  BP: (!) 141/106 133/84 121/82 123/80  Pulse:      Resp: 13 16 15 16   Temp:  98 F (36.7 C)  98.2 F (36.8 C)  SpO2: 95% 96% 95% 96%  Weight:      Height:         Start Time: 0906 hrs. End Time: 0917 hrs.  Imaging Guidance (Spinal):          Type of Imaging Technique: Fluoroscopy Guidance (Spinal) Indication(s): Assistance in needle guidance and placement for procedures requiring needle placement in or near specific anatomical locations not easily accessible without such assistance. Exposure Time: Please see nurses notes. Contrast: None used. Fluoroscopic Guidance: I was personally present during the use of fluoroscopy. "Tunnel Vision Technique" used to obtain the best possible view of the target area. Parallax error corrected before commencing the procedure. "Direction-depth-direction" technique used to introduce the needle under continuous pulsed fluoroscopy. Once target was reached, antero-posterior, oblique, and lateral fluoroscopic projection used confirm needle placement in all planes. Images permanently stored in EMR. Interpretation: No contrast injected. I personally interpreted the imaging intraoperatively. Adequate needle placement confirmed in multiple planes. Permanent images saved into the patient's record.  Antibiotic Prophylaxis:   Anti-infectives (From admission, onward)   None     Indication(s): None identified  Post-operative Assessment:  Post-procedure Vital Signs:  Pulse/HCG Rate: 8381 Temp: 98.2 F (36.8 C) Resp: 16 BP: 123/80 SpO2: 96 %  EBL: None  Complications: No immediate post-treatment complications observed by team, or reported by patient.  Note: The patient  tolerated the entire procedure well. A repeat set of vitals were taken after the procedure and the patient was kept under observation following institutional policy, for this type of procedure. Post-procedural neurological assessment was performed, showing return to baseline, prior to discharge. The patient was provided with post-procedure discharge instructions, including a section on how to identify potential problems. Should any problems arise concerning this procedure, the patient was given instructions to immediately contact , at any time, without hesitation. In any case, we plan to contact the patient by telephone for a follow-up status report regarding this interventional procedure.  Comments:  No additional relevant information.  Plan of Care  Orders:  Orders Placed This Encounter  Procedures   DG PAIN CLIN[facility] C-ARM 1-60 MIN NO REPORT    Intraoperative interpretation by procedural physician at [facility].    Standing Status:   Standing    Number of Occurrences:   1    Order Specific Question:   Reason for exam:    Answer:   Assistance in needle guidance and placement for procedures requiring needle placement in or near specific anatomical locations not easily accessible without such assistance.    Medications ordered for procedure: Meds ordered this encounter  Medications   lidocaine (XYLOCAINE) 2 % (with pres) injection 400 mg   fentaNYL (SUBLIMAZE) injection 25-50 mcg    Make sure Narcan is available in the pyxis when using this medication. In the event of respiratory depression (RR< 8/min): Titrate NARCAN (naloxone) in increments of 0.1 to 0.2 mg IV at 2-3 minute intervals, until desired degree of reversal.   ropivacaine (PF) 2 mg/mL (0.2%) (NAROPIN) injection 9 mL   dexamethasone (DECADRON) injection 10 mg   ropivacaine (PF) 2 mg/mL (0.2%) (NAROPIN) injection 9 mL   dexamethasone (DECADRON) injection 10 mg   Medications administered: We administered  lidocaine, fentaNYL, ropivacaine (PF) 2 mg/mL (0.2%), dexamethasone, ropivacaine (PF) 2 mg/mL (0.2%), and dexamethasone.  See the medical  record for exact dosing, route, and time of administration.  Follow-up plan:   Return in about 4 weeks (around 03/20/2020) for PP  VV.      Status post bilateral L3, L4, L5 lumbar facet medial branch nerve block #1 02/21/2020.   Recent Visits Date Type Provider Dept  01/27/20 Office Visit Edward Jolly, MD [facility]  Showing recent visits within past 90 days and meeting all other requirements Today's Visits Date Type Provider Dept  02/21/20 Procedure visit Edward Jolly, MD [facility]  Showing today's visits and meeting all other requirements Future Appointments Date Type Provider Dept  03/20/20 Appointment Edward Jolly, MD [facility]  Showing future appointments within next 90 days and meeting all other requirements  Disposition: Discharge home  Discharge (Date   Time): 02/21/2020; 0950 hrs.   Primary Care Physician: Jaclyn Shaggy, MD Location: [facility] Note by: Edward Jolly, MD Date: 02/21/2020; Time: 10:42 AM  Disclaimer:  Medicine is not an exact science. The only guarantee in medicine is that nothing is guaranteed. It is important to note that the decision to proceed with this intervention was based on the information collected from the patient. The Data and conclusions were drawn from the patient's questionnaire, the interview, and the physical examination. Because the information was provided in large part by the patient, it cannot be guaranteed that it has not been purposely or unconsciously manipulated. Every effort has been made to obtain as much relevant data as possible for this evaluation. It is important to note that the conclusions that lead to this procedure are derived in large part from the available data. Always take into account that the treatment will also be  dependent on availability of resources and existing treatment guidelines, considered by other Pain Management Practitioners as being common knowledge and practice, at the time of the intervention. For Medico-Legal purposes, it is also important to point out that variation in procedural techniques and pharmacological choices are the acceptable norm. The indications, contraindications, technique, and results of the above procedure should only be interpreted and judged by a Board-Certified Interventional Pain Specialist with extensive familiarity and expertise in the same exact procedure and technique.

## 2020-02-21 NOTE — Progress Notes (Signed)
Safety precautions to be maintained throughout the outpatient stay will include: orient to surroundings, keep bed in low position, maintain call bell within reach at all times, provide assistance with transfer out of bed and ambulation.  

## 2020-02-22 ENCOUNTER — Telehealth: Payer: Self-pay

## 2020-02-22 NOTE — Telephone Encounter (Signed)
Pt was called about his procedure yesterday and he was at work per his wife. She stated that last night his pain was off the roof and she was going to text him to see how he was doing this morning. Informed her to have him to call the office if he need [place].

## 2020-03-20 ENCOUNTER — Encounter: Payer: Self-pay | Admitting: Student in an Organized Health Care Education/Training Program

## 2020-03-20 ENCOUNTER — Ambulatory Visit
Payer: BC Managed Care – PPO | Attending: Student in an Organized Health Care Education/Training Program | Admitting: Student in an Organized Health Care Education/Training Program

## 2020-03-20 ENCOUNTER — Other Ambulatory Visit: Payer: Self-pay

## 2020-03-20 DIAGNOSIS — G894 Chronic pain syndrome: Secondary | ICD-10-CM | POA: Diagnosis not present

## 2020-03-20 DIAGNOSIS — M47816 Spondylosis without myelopathy or radiculopathy, lumbar region: Secondary | ICD-10-CM | POA: Diagnosis not present

## 2020-03-20 DIAGNOSIS — M5136 Other intervertebral disc degeneration, lumbar region: Secondary | ICD-10-CM | POA: Diagnosis not present

## 2020-03-20 NOTE — Progress Notes (Signed)
Patient: Jonathan Lester  Service Category: E/M  Provider: Gillis Santa, MD  DOB: 05-09-1960  DOS: 03/20/2020  Location: Office  MRN: [id number]  Setting: Ambulatory outpatient  Referring Provider: Albina Billet, MD  Type: Established Patient  Specialty: Interventional Pain Management  PCP: Albina Billet, MD  Location: Home  Delivery: TeleHealth     Virtual Encounter - Pain Management PROVIDER NOTE: Information contained herein reflects review and annotations entered in association with encounter. Interpretation of such information and data should be left to medically-trained personnel. Information provided to patient can be located elsewhere in the medical record under "Patient Instructions". Document created using STT-dictation technology, any transcriptional errors that may result from process are unintentional.    Contact & Pharmacy Preferred: [phone] Home: [phone] (home) Mobile: [phone] (mobile) E-mail: [email or web address] [place], [address] [place] [address] Phone: [phone] Fax: [phone]   Pre-screening  Mr. Jonathan Lester offered "in-person" vs "virtual" encounter. He indicated preferring virtual for this encounter.   Reason COVID-19*  Social distancing based on CDC and AMA recommendations.   I contacted Jonathan Lester on 03/20/2020 via telephone.      I clearly identified myself as Gillis Santa, MD. I verified that I was speaking with the correct person using two identifiers (Name: Jonathan Lester, and date of birth: March 24, 1961).  Consent I sought verbal advanced consent from Jonathan Lester for virtual visit interactions. I informed Jonathan Lester of possible security and privacy concerns, risks, and limitations associated with providing "not-in-person" medical evaluation and management services. I also informed Jonathan Lester of the availability of "in-person" appointments.  Finally, I informed him that there would be a charge for the virtual visit and that he could be  personally, fully or partially, financially responsible for it. Jonathan Lester expressed understanding and agreed to proceed.   Historic Elements   Jonathan Lester is a 59 y.o. year old, male patient evaluated today after our last contact on 02/21/2020. Jonathan Lester  has a past medical history of History of kidney stones, Hyperlipidemia, Hypertension, and Sleep apnea. He also  has a past surgical history that includes Cholecystectomy; Plantar fascia release (Left); Rotator cuff repair (Bilateral); Tonsillectomy; and Plantar fascia release (Left, 12/25/2018). Jonathan Lester has a current medication list which includes the following prescription(s): aspirin-acetaminophen-caffeine, cetirizine, diclofenac sodium, emgality, fenofibrate, gabapentin, irbesartan, meloxicam, multivitamin with minerals, nortriptyline, nortriptyline, omeprazole, oxymetazoline, pantoprazole, simvastatin, tamsulosin, tizanidine, venlafaxine, celecoxib, ibuprofen, and prednisone. He  reports that he has never smoked. He has never used smokeless tobacco. He reports current alcohol use. He reports that he does not use drugs. Jonathan Lester is allergic to codeine.   HPI  Today, he is being contacted for a post-procedure assessment.   Post-Procedure Evaluation  Procedure (02/21/2020):  Type: Lumbar Facet, Medial Branch Block(s) #1  Primary Purpose: Diagnostic Region: Posterolateral Lumbosacral Spine Level:  L3, L4, L5, Medial Branch Level(s). Injecting these levels blocks the L3-4, L4-5,  lumbar facet joints. Laterality: Bilateral  Sedation: Please see nurses note.  Effectiveness during initial hour after procedure(Ultra-Short Term Relief): 80 %   Local anesthetic used: Long-acting (4-6 hours) Effectiveness: Defined as any analgesic benefit obtained secondary to the administration of local anesthetics. This carries significant diagnostic value  as to the etiological location, or anatomical origin, of the pain. Duration of benefit is expected to coincide with the duration of the local anesthetic  used.  Effectiveness during initial 4-6 hours after procedure(Short-Term Relief): 80 %   Long-term benefit: Defined as any relief past the pharmacologic duration of the local anesthetics.  Effectiveness past the initial 6 hours after procedure(Long-Term Relief): >50% pain relief for at least 5 days, back to baseline by day 7/8.  Patient states that he was able to walk more comfortably during the first 5 days.  Current benefits: Defined as benefit that persist at this time.   Analgesia:  Back to baseline Function: Back to baseline ROM: Back to baseline   Plan: Repeat lumbar facet medial branch nerve block #2 and then consider lumbar radiofrequency ablation Laboratory Chemistry Profile   Renal Lab Results  Component Value Date   BUN 27 (H) 01/23/2017   CREATININE 1.18 01/23/2017   GFRAA >60 01/23/2017   GFRNONAA >60 01/23/2017     Hepatic Lab Results  Component Value Date   AST 22 01/23/2017   ALT 27 01/23/2017   ALBUMIN 4.9 01/23/2017   ALKPHOS 33 (L) 01/23/2017   LIPASE 19 01/23/2017     Electrolytes Lab Results  Component Value Date   NA 138 01/23/2017   K 3.7 01/23/2017   CL 104 01/23/2017   CALCIUM 9.2 01/23/2017     Bone No results found for: VD25OH, VD125OH2TOT, GG2694WN4, OE7035KK9, 25OHVITD1, 25OHVITD2, 25OHVITD3, TESTOFREE, TESTOSTERONE   Inflammation (CRP: Acute Phase) (ESR: Chronic Phase) No results found for: CRP, ESRSEDRATE, LATICACIDVEN     Note: Above Lab results reviewed.   Assessment  The primary encounter diagnosis was Lumbar facet arthropathy. Diagnoses of Lumbar degenerative disc disease and Chronic pain syndrome were also pertinent to this visit.  Plan of Care   Plan: Repeat lumbar facet medial branch nerve block #2 and then consider lumbar radiofrequency ablation  Orders:  Orders Placed  This Encounter  Procedures  . LUMBAR FACET(MEDIAL BRANCH NERVE BLOCK) MBNB    Standing Status:   Future    Standing Expiration Date:   04/20/2020    Scheduling Instructions:     Procedure: Lumbar facet block (AKA.: Lumbosacral medial branch nerve block)     Side: Bilateral     Level: L3-4, L4-5, Facets (L3, L4, L5, Medial Branch Nerves) #2     Sedation: Patient's choice.     Timeframe: ASAA    Order Specific Question:   Where will this procedure be performed?    Answer:   [facility]   Follow-up plan:   Return in about 4 weeks (around 04/17/2020) for B/L L3, 4, 5 FCT #2 , with sedation.     Status post bilateral L3, L4, L5 lumbar facet medial branch nerve block #1 02/21/2020, plan for #2 and then possible RFA.    Recent Visits Date Type Provider Dept  02/21/20 Procedure visit Gillis Santa, MD [facility]  01/27/20 Office Visit Gillis Santa, MD [facility]  Showing recent visits within past 90 days and meeting all other requirements Today's Visits Date Type Provider Dept  03/20/20 Telemedicine Gillis Santa, MD [facility]  Showing today's visits and meeting all other requirements Future Appointments No visits were found meeting these conditions. Showing future appointments within next 90 days and meeting all other requirements  I discussed the assessment and treatment plan with the patient. The patient was provided an opportunity to ask questions and all were answered. The patient agreed with the plan and demonstrated an understanding of the instructions.  Patient advised to call back or seek an in-person evaluation if the  symptoms or condition worsens.  Duration of encounter: 20 minutes.  Note by: Gillis Santa, MD Date: 03/20/2020; Time: 4:01 PM

## 2020-06-13 ENCOUNTER — Ambulatory Visit: Payer: Self-pay | Attending: Internal Medicine

## 2020-06-13 ENCOUNTER — Other Ambulatory Visit (HOSPITAL_COMMUNITY): Payer: Self-pay | Admitting: Internal Medicine

## 2020-06-13 DIAGNOSIS — Z23 Encounter for immunization: Secondary | ICD-10-CM

## 2020-06-13 NOTE — Progress Notes (Signed)
   Covid-19 Vaccination Clinic  Name:  Jonathan Lester    MRN: [id number] DOB: 11/26/1960  06/13/2020  Mr. Jonathan Lester was observed post Covid-19 immunization for 15 minutes without incident. He was provided with Vaccine Information Sheet and instruction to access the V-Safe system.   Mr. Jonathan Lester was instructed to call 911 with any severe reactions post vaccine: [name] Kitchen Difficulty breathing  . Swelling of face and throat  . A fast heartbeat  . A bad rash all over body  . Dizziness and weakness   Immunizations Administered    Name Date Dose VIS Date Route   Moderna Covid-19 Booster Vaccine 06/13/2020 10:18 AM 0.25 mL 01/26/2020 Intramuscular   Manufacturer: Moderna   Lot: [id number]   NDC: 61443-154-00

## 2021-01-17 MED ORDER — DIPHENHYDRAMINE HCL 25 MG PO CAPS: 25.0000 mg | ORAL_CAPSULE | ORAL | Status: AC

## 2021-01-17 MED ORDER — PROMETHAZINE HCL 25 MG/ML IJ SOLN: 25.0000 mg | Freq: Four times a day (QID) | INTRAMUSCULAR | Status: DC | PRN

## 2021-01-17 MED ORDER — LEVOFLOXACIN 500 MG PO TABS: 500.0000 mg | ORAL_TABLET | Freq: Once | ORAL | Status: AC

## 2021-01-17 MED ORDER — MORPHINE SULFATE (PF) 2 MG/ML IV SOLN
10.0000 mg | Freq: Once | INTRAVENOUS | Status: DC
Start: 2021-01-17 — End: 2021-01-18
  Filled 2021-01-17: qty 5

## 2021-01-17 MED ORDER — MIDAZOLAM HCL 2 MG/2ML IJ SOLN: 1.0000 mg | Freq: Once | INTRAMUSCULAR | Status: AC

## 2021-01-17 MED ORDER — DEXTROSE-NACL 5-0.45 % IV SOLN: INTRAVENOUS | Status: DC

## 2021-01-18 ENCOUNTER — Encounter
Admission: RE | Disposition: A | Discharge status: Home / Self Care | Payer: Self-pay | Source: Home / Self Care | Attending: Urology

## 2021-01-18 ENCOUNTER — Encounter: Payer: Self-pay | Admitting: Urology

## 2021-01-18 ENCOUNTER — Ambulatory Visit
Admission: RE | Admit: 2021-01-18 | Discharge: 2021-01-18 | Disposition: A | Discharge status: Home / Self Care | Payer: BC Managed Care – PPO | Attending: Urology | Admitting: Urology

## 2021-01-18 DIAGNOSIS — N200 Calculus of kidney: Secondary | ICD-10-CM | POA: Insufficient documentation

## 2021-01-18 DIAGNOSIS — E669 Obesity, unspecified: Secondary | ICD-10-CM | POA: Diagnosis not present

## 2021-01-18 DIAGNOSIS — G4730 Sleep apnea, unspecified: Secondary | ICD-10-CM | POA: Insufficient documentation

## 2021-01-18 DIAGNOSIS — I10 Essential (primary) hypertension: Secondary | ICD-10-CM | POA: Insufficient documentation

## 2021-01-18 HISTORY — PX: EXTRACORPOREAL SHOCK WAVE LITHOTRIPSY: SHX1557

## 2021-01-18 LAB — GLUCOSE, CAPILLARY: Glucose-Capillary: 98 mg/dL (ref 70–99)

## 2021-01-18 SURGERY — LITHOTRIPSY, ESWL
Anesthesia: Moderate Sedation | Laterality: Right

## 2021-01-18 MED ORDER — DIPHENHYDRAMINE HCL 25 MG PO CAPS
ORAL_CAPSULE | ORAL | Status: AC
  Administered 2021-01-18: 25 mg via ORAL
  Filled 2021-01-18: qty 1

## 2021-01-18 MED ORDER — FUROSEMIDE 10 MG/ML IJ SOLN: 10.0000 mg | Freq: Once | INTRAMUSCULAR | Status: DC

## 2021-01-18 MED ORDER — MIDAZOLAM HCL 2 MG/2ML IJ SOLN
INTRAMUSCULAR | Status: AC
  Administered 2021-01-18: 1 mg via INTRAMUSCULAR
  Filled 2021-01-18: qty 2

## 2021-01-18 MED ORDER — PROMETHAZINE HCL 25 MG/ML IJ SOLN
INTRAMUSCULAR | Status: AC
  Administered 2021-01-18: 25 mg via INTRAMUSCULAR
  Filled 2021-01-18: qty 1

## 2021-01-18 MED ORDER — LEVOFLOXACIN 500 MG PO TABS
ORAL_TABLET | ORAL | Status: AC
  Administered 2021-01-18: 500 mg via ORAL
  Filled 2021-01-18: qty 1

## 2021-01-18 MED ORDER — ACETAMINOPHEN-CODEINE #3 300-30 MG PO TABS: 1.0000 | ORAL_TABLET | ORAL | 1 refills | Status: DC | PRN

## 2021-01-18 MED ORDER — MORPHINE SULFATE (PF) 10 MG/ML IV SOLN
INTRAVENOUS | Status: AC
  Administered 2021-01-18: 10 mg
  Filled 2021-01-18: qty 1

## 2021-01-18 MED ORDER — ONDANSETRON 8 MG PO TBDP: 8.0000 mg | ORAL_TABLET | Freq: Four times a day (QID) | ORAL | 3 refills | Status: DC | PRN

## 2021-01-18 NOTE — Discharge Instructions (Addendum)
AMBULATORY SURGERY  DISCHARGE INSTRUCTIONS   The drugs that you were given will stay in your system until tomorrow so for the next 24 hours you should not:  Drive an automobile Make any legal decisions Drink any alcoholic beverage   You may resume regular meals tomorrow.  Today it is better to start with liquids and gradually work up to solid foods.  You may eat anything you prefer, but it is better to start with liquids, then soup and crackers, and gradually work up to solid foods.   Please notify your doctor immediately if you have any unusual bleeding, trouble breathing, redness and pain at the surgery site, drainage, fever, or pain not relieved by medication.    Additional Instructions: Please contact your physician with any problems or Same Day Surgery at [phone], Monday through Friday 6 am to 4 pm, or [place] [facility] number at [phone].   Lithotripsy, Care After This sheet gives you information about how to care for yourself after your procedure. Your health care provider may also give you more specific instructions. If you have problems or questions, contact your health care provider. What can I expect after the procedure? After the procedure, it is common to have: Some blood in your urine. This should only last for a few days. Soreness in your back, sides, or upper abdomen for a few days. Blotches or bruises on the area where the shock wave entered the skin. Pain, discomfort, or nausea when pieces (fragments) of the kidney stone move through the tube that carries urine from the kidney to the bladder (ureter). Stone fragments may pass soon after the procedure, but they may continue to pass for up to 4-8 weeks. If you have severe pain or nausea, contact your health care provider. This may be caused by a large stone that was not broken up, and this may mean that you need more treatment. Some pain or discomfort during urination. Some pain or discomfort in  the lower abdomen or (in men) at the base of the penis. Follow these instructions at home: Medicines Take over-the-counter and prescription medicines only as told by your health care provider. If you were prescribed an antibiotic medicine, take it as told by your health care provider. Do not stop taking the antibiotic even if you start to feel better. Ask your health care provider if the medicine prescribed to you requires you to avoid driving or using machinery. Eating and drinking    Drink enough fluid to keep your urine pale yellow. This helps any remaining pieces of the stone to pass. It can also help prevent new stones from forming. Eat plenty of fresh fruits and vegetables. Follow instructions from your health care provider about eating or drinking restrictions. You may be instructed to: Reduce how much salt (sodium) you eat or drink. Check ingredients and nutrition facts on packaged foods and beverages to see how much sodium they contain. Reduce how much meat you eat. Eat the recommended amount of calcium for your age and gender. Ask your health care provider how much calcium you should have. General instructions Get plenty of rest. Return to your normal activities as told by your health care provider. Ask your health care provider what activities are safe for you. Most people can resume normal activities 1-2 days after the procedure. If you were given a sedative during the procedure, it can affect you for several hours. Do not drive or operate machinery until your health care provider says that  it is safe. Your health care provider may direct you to lie in a certain position (postural drainage) and tap firmly (percuss) over your kidney area to help stone fragments pass. Follow instructions as told by your health care provider. If directed, strain all urine through the strainer that was provided by your health care provider. Keep all fragments for your health care provider to see. Any  stones that are found may be sent to a medical lab for examination. The stone may be as small as a grain of salt. Keep all follow-up visits as told by your health care provider. This is important. Contact a health care provider if: You have a fever or chills. You have nausea that is severe or does not go away. You have any of these urinary symptoms: Blood in your urine for longer than your health care provider told you to expect. Urine that smells bad or unusual. Feeling a strong urge to urinate after emptying your bladder. Pain or burning with urination that does not go away. Urinating more often than usual and this does not go away. You have a stent and it comes out. Get help right away if: You have severe pain in your back, sides, or upper abdomen. You have any of these urinary symptoms: Severe pain while urinating. More blood in your urine or having blood in your urine when you did not before. Passing blood clots in your urine. Passing only a small amount of urine or being unable to pass any urine at all. You have severe nausea that leads to persistent vomiting. You faint. Summary After this procedure, it is common to have some pain, discomfort, or nausea when pieces (fragments) of the kidney stone move through the tube that carries urine from the kidney to the bladder (ureter). If this pain or nausea is severe, however, you should contact your health care provider. Return to your normal activities as told by your health care provider. Ask your health care provider what activities are safe for you. Drink enough fluid to keep your urine pale yellow. This helps any remaining pieces of the stone to pass, and it can help prevent new stones from forming. If directed, strain your urine and keep all fragments for your health care provider to see. Fragments or stones may be as small as a grain of salt. Get help right away if you have severe pain in your back, sides, or upper abdomen, or if you  have severe pain while urinating. This information is not intended to replace advice given to you by your health care provider. Make sure you discuss any questions you have with your health care provider. Document Revised: 01/05/2019 Document Reviewed: 01/06/2019 Elsevier Patient Education  2022 [employer].

## 2021-01-18 NOTE — Progress Notes (Signed)
   01/18/21 0800  Clinical Encounter Type  Visited With Patient  Visit Type Initial;Spiritual support;Social support  Referral From Chaplain  Consult/Referral To Chaplain   Chaplain established initial spiritual care. Chaplain provided emotional support for PT before surgery. Chaplain made space for expression of emotions.

## 2021-03-15 ENCOUNTER — Encounter: Payer: Self-pay | Admitting: Urology

## 2021-03-15 ENCOUNTER — Encounter
Admission: RE | Disposition: A | Discharge status: Home / Self Care | Payer: Self-pay | Source: Home / Self Care | Attending: Urology

## 2021-03-15 ENCOUNTER — Ambulatory Visit
Admission: RE | Admit: 2021-03-15 | Discharge: 2021-03-15 | Disposition: A | Discharge status: Home / Self Care | Payer: BC Managed Care – PPO | Attending: Urology | Admitting: Urology

## 2021-03-15 ENCOUNTER — Other Ambulatory Visit: Payer: Self-pay

## 2021-03-15 DIAGNOSIS — N200 Calculus of kidney: Secondary | ICD-10-CM | POA: Insufficient documentation

## 2021-03-15 HISTORY — PX: EXTRACORPOREAL SHOCK WAVE LITHOTRIPSY: SHX1557

## 2021-03-15 SURGERY — LITHOTRIPSY, ESWL
Anesthesia: Moderate Sedation | Laterality: Left

## 2021-03-15 MED ORDER — FUROSEMIDE 10 MG/ML IJ SOLN
10.0000 mg | Freq: Once | INTRAMUSCULAR | Status: AC
  Administered 2021-03-15: 10 mg via INTRAVENOUS

## 2021-03-15 MED ORDER — MORPHINE SULFATE (PF) 10 MG/ML IV SOLN
INTRAVENOUS | Status: AC
  Administered 2021-03-15: 10 mg via INTRAMUSCULAR
  Filled 2021-03-15: qty 1

## 2021-03-15 MED ORDER — ONDANSETRON 4 MG PO TBDP
ORAL_TABLET | ORAL | Status: AC
  Filled 2021-03-15: qty 1

## 2021-03-15 MED ORDER — LEVOFLOXACIN 500 MG PO TABS: 500.0000 mg | ORAL_TABLET | Freq: Every day | ORAL | Status: DC

## 2021-03-15 MED ORDER — LEVOFLOXACIN 500 MG PO TABS
ORAL_TABLET | ORAL | Status: AC
  Administered 2021-03-15: 500 mg via ORAL
  Filled 2021-03-15: qty 1

## 2021-03-15 MED ORDER — DEXTROSE-NACL 5-0.45 % IV SOLN: INTRAVENOUS | Status: DC

## 2021-03-15 MED ORDER — DIPHENHYDRAMINE HCL 25 MG PO CAPS
ORAL_CAPSULE | ORAL | Status: AC
  Administered 2021-03-15: 25 mg via ORAL
  Filled 2021-03-15: qty 1

## 2021-03-15 MED ORDER — ONDANSETRON 4 MG PO TBDP
4.0000 mg | ORAL_TABLET | Freq: Once | ORAL | Status: AC
  Administered 2021-03-15: 4 mg via ORAL

## 2021-03-15 MED ORDER — MIDAZOLAM HCL 2 MG/2ML IJ SOLN: 1.0000 mg | Freq: Once | INTRAMUSCULAR | Status: AC

## 2021-03-15 MED ORDER — PROMETHAZINE HCL 25 MG/ML IJ SOLN: 25.0000 mg | Freq: Once | INTRAMUSCULAR | Status: AC

## 2021-03-15 MED ORDER — DIPHENHYDRAMINE HCL 25 MG PO CAPS: 25.0000 mg | ORAL_CAPSULE | Freq: Four times a day (QID) | ORAL | Status: DC | PRN

## 2021-03-15 MED ORDER — MIDAZOLAM HCL 2 MG/2ML IJ SOLN
INTRAMUSCULAR | Status: AC
  Administered 2021-03-15: 1 mg via INTRAMUSCULAR
  Filled 2021-03-15: qty 2

## 2021-03-15 MED ORDER — FUROSEMIDE 10 MG/ML IJ SOLN
INTRAMUSCULAR | Status: AC
  Filled 2021-03-15: qty 2

## 2021-03-15 MED ORDER — PROMETHAZINE HCL 25 MG/ML IJ SOLN
INTRAMUSCULAR | Status: AC
  Administered 2021-03-15: 25 mg via INTRAMUSCULAR
  Filled 2021-03-15: qty 1

## 2021-03-15 MED ORDER — MORPHINE SULFATE (PF) 10 MG/ML IV SOLN: 10.0000 mg | Freq: Once | INTRAVENOUS | Status: AC

## 2021-03-15 NOTE — Discharge Instructions (Addendum)
Lithotripsy, Care After This sheet gives you information about how to care for yourself after your procedure. Your health care provider may also give you more specific instructions. If you have problems or questions, contact your health care provider. What can I expect after the procedure? After the procedure, it is common to have: Some blood in your urine. This should only last for a few days. Soreness in your back, sides, or upper abdomen for a few days. Blotches or bruises on the area where the shock wave entered the skin. Pain, discomfort, or nausea when pieces (fragments) of the kidney stone move through the tube that carries urine from the kidney to the bladder (ureter). Stone fragments may pass soon after the procedure, but they may continue to pass for up to 4-8 weeks. If you have severe pain or nausea, contact your health care provider. This may be caused by a large stone that was not broken up, and this may mean that you need more treatment. Some pain or discomfort during urination. Some pain or discomfort in the lower abdomen or (in men) at the base of the penis. Follow these instructions at home: Medicines Take over-the-counter and prescription medicines only as told by your health care provider. If you were prescribed an antibiotic medicine, take it as told by your health care provider. Do not stop taking the antibiotic even if you start to feel better. Ask your health care provider if the medicine prescribed to you requires you to avoid driving or using machinery. Eating and drinking A comparison of three sample cups showing dark yellow, yellow, and pale yellow urine.    A plate with examples of foods in a healthy diet.  Drink enough fluid to keep your urine pale yellow. This helps any remaining pieces of the stone to pass. It can also help prevent new stones from forming. Eat plenty of fresh fruits and vegetables. Follow instructions from your health care provider about eating  or drinking restrictions. You may be instructed to: Reduce how much salt (sodium) you eat or drink. Check ingredients and nutrition facts on packaged foods and beverages to see how much sodium they contain. Reduce how much meat you eat. Eat the recommended amount of calcium for your age and gender. Ask your health care provider how much calcium you should have. General instructions Get plenty of rest. Return to your normal activities as told by your health care provider. Ask your health care provider what activities are safe for you. Most people can resume normal activities 1-2 days after the procedure. If you were given a sedative during the procedure, it can affect you for several hours. Do not drive or operate machinery until your health care provider says that it is safe. Your health care provider may direct you to lie in a certain position (postural drainage) and tap firmly (percuss) over your kidney area to help stone fragments pass. Follow instructions as told by your health care provider. If directed, strain all urine through the strainer that was provided by your health care provider. Keep all fragments for your health care provider to see. Any stones that are found may be sent to a medical lab for examination. The stone may be as small as a grain of salt. Keep all follow-up visits as told by your health care provider. This is important. Contact a health care provider if: You have a fever or chills. You have nausea that is severe or does not go away. You have any of these  urinary symptoms: Blood in your urine for longer than your health care provider told you to expect. Urine that smells bad or unusual. Feeling a strong urge to urinate after emptying your bladder. Pain or burning with urination that does not go away. Urinating more often than usual and this does not go away. You have a stent and it comes out. Get help right away if: You have severe pain in your back, sides, or upper  abdomen. You have any of these urinary symptoms: Severe pain while urinating. More blood in your urine or having blood in your urine when you did not before. Passing blood clots in your urine. Passing only a small amount of urine or being unable to pass any urine at all. You have severe nausea that leads to persistent vomiting. You faint. Summary After this procedure, it is common to have some pain, discomfort, or nausea when pieces (fragments) of the kidney stone move through the tube that carries urine from the kidney to the bladder (ureter). If this pain or nausea is severe, however, you should contact your health care provider. Return to your normal activities as told by your health care provider. Ask your health care provider what activities are safe for you. Drink enough fluid to keep your urine pale yellow. This helps any remaining pieces of the stone to pass, and it can help prevent new stones from forming. If directed, strain your urine and keep all fragments for your health care provider to see. Fragments or stones may be as small as a grain of salt. Get help right away if you have severe pain in your back, sides, or upper abdomen, or if you have severe pain while urinating. This information is not intended to replace advice given to you by your health care provider. Make sure you discuss any questions you have with your health care provider. Document Revised: 11/27/2020 Document Reviewed: 11/27/2020 Elsevier Patient Education  2022 [employer] Inc[employer]    AMBULATORY SURGERY  DISCHARGE INSTRUCTIONS   The drugs that you were given will stay in your system until tomorrow so for the next 24 hours you should not:  Drive an automobile Make any legal decisions Drink any alcoholic beverage   You may resume regular meals tomorrow.  Today it is better to start with liquids and gradually work up to solid foods.  You may eat anything you prefer, but it is better to start with liquids, then  soup and crackers, and gradually work up to solid foods.   Please notify your doctor immediately if you have any unusual bleeding, trouble breathing, redness and pain at the surgery site, drainage, fever, or pain not relieved by medication.    Your post-operative visit with Dr.                                       is: Date:                        Time:    Please call to schedule your post-operative visit.  Additional Instructions:

## 2021-05-03 IMAGING — MR MR LUMBAR SPINE W/O CM
5 series · 31 of 48 positions shown · non-contrast
Comparison: Images from 11/25/2018 study are unavailable

CLINICAL DATA: Chronic low back, left leg tingling, right leg
numbness

EXAM:
MRI LUMBAR SPINE WITHOUT CONTRAST
TECHNIQUE: Multiplanar, multisequence MR imaging of the lumbar spine was
performed. No intravenous contrast was administered.

[Series 5: T2 · sagittal · 4.0mm · 0.81mm/px · 6 of 17 slices shown (1 of 2)]
[im 1/17]
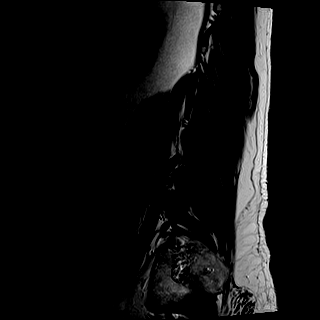
[im 4/17]
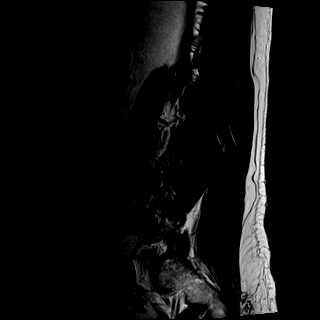
[im 7/17]
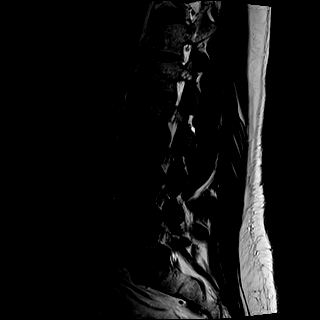
[im 10/17]
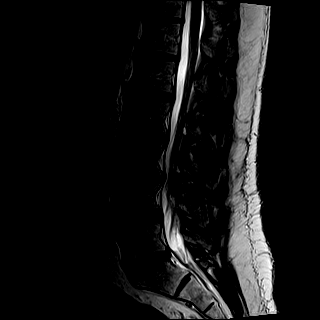
[im 13/17]
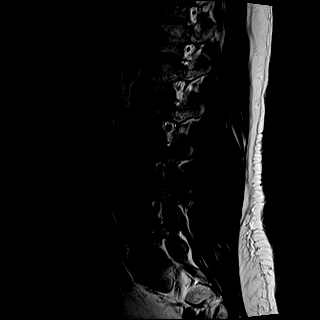
[im 17/17]
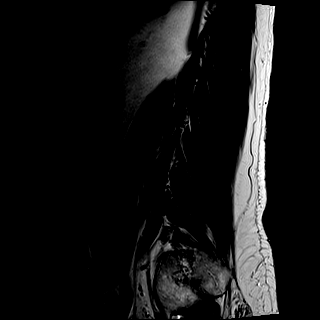

[Series 6: T1 · sagittal · 4.0mm · 0.81mm/px · 7 of 17 slices shown (1 of 2)]
[im 1/17]
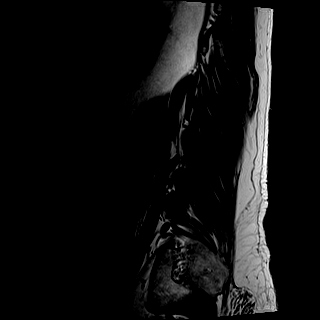
[im 3/17]
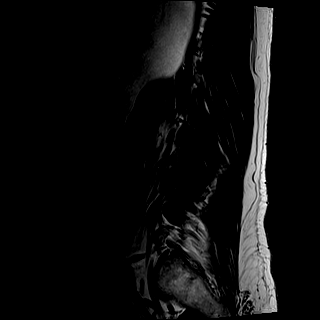
[im 6/17]
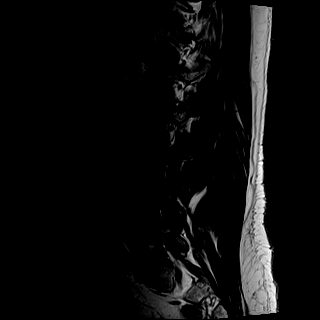
[im 9/17]
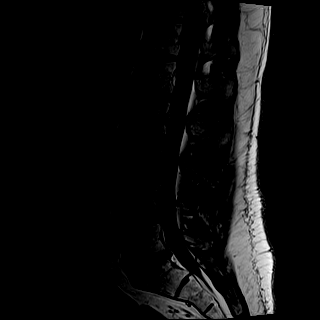
[im 11/17]
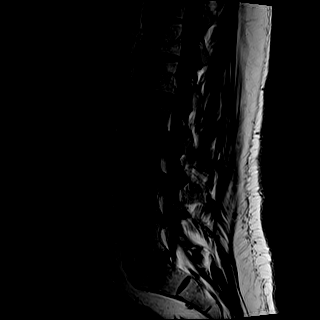
[im 14/17]
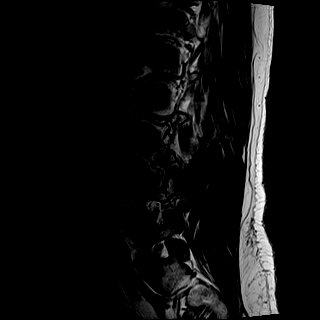
[im 17/17]
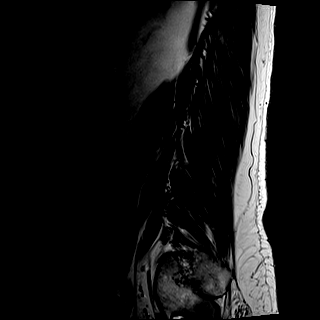

[Series 7: STIR · sagittal · 4.0mm · 0.41mm/px · 2 of 17 slices shown]
[im 1/17]
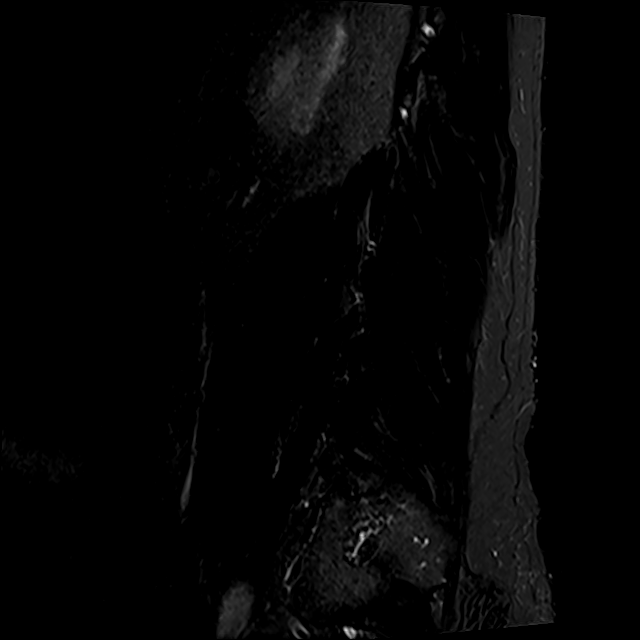
[im 3/17]
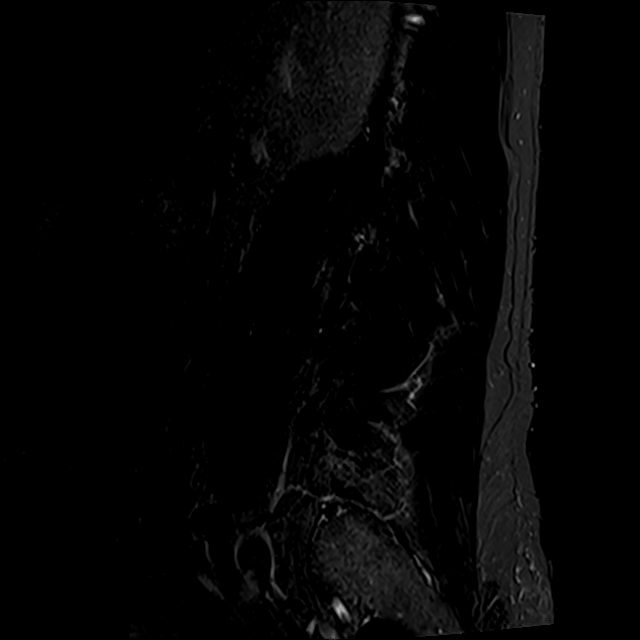

[Series 8: T2 · axial · 4.0mm · 0.78mm/px · z∈[-97,+105]mm · 8 of 36 slices shown (2 of 2)]
[im 1/36]
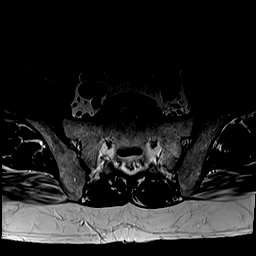
[im 6/36]
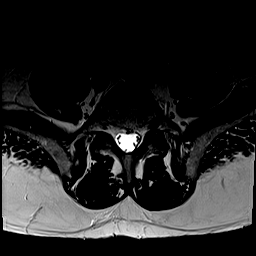
[im 11/36]
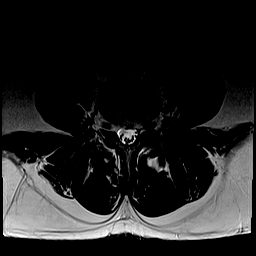
[im 17/36]
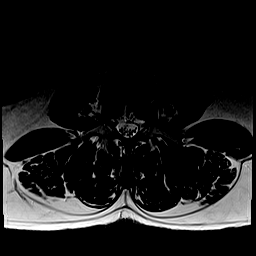
[im 19/36]
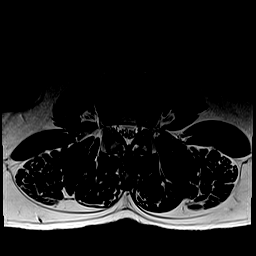
[im 25/36]
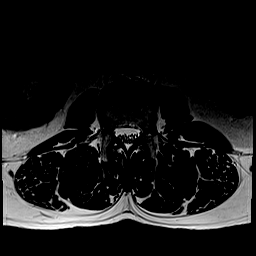
[im 30/36]
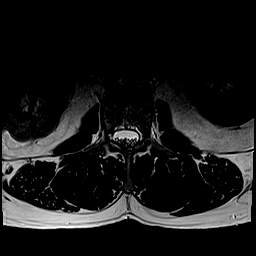
[im 36/36]
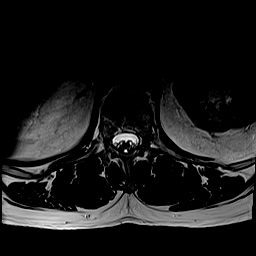

[Series 9: T1 · axial · 4.0mm · 0.39mm/px · z∈[-97,+105]mm · 8 of 36 slices shown (2 of 2)]
[im 1/36]
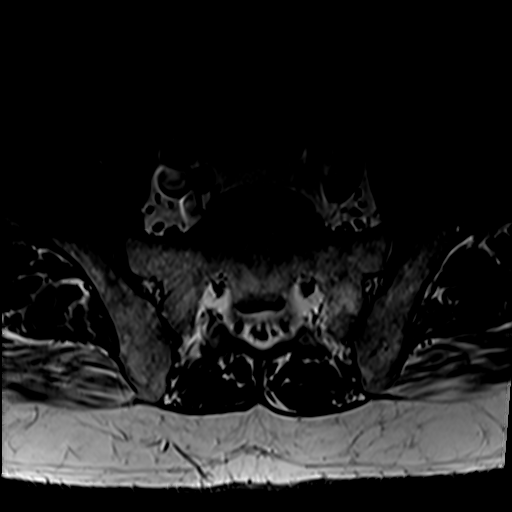
[im 6/36]
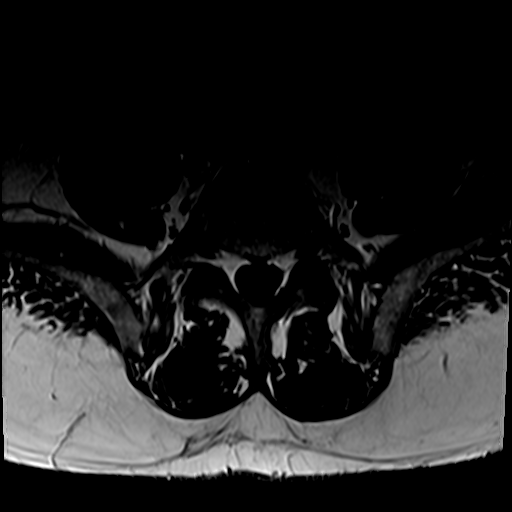
[im 11/36]
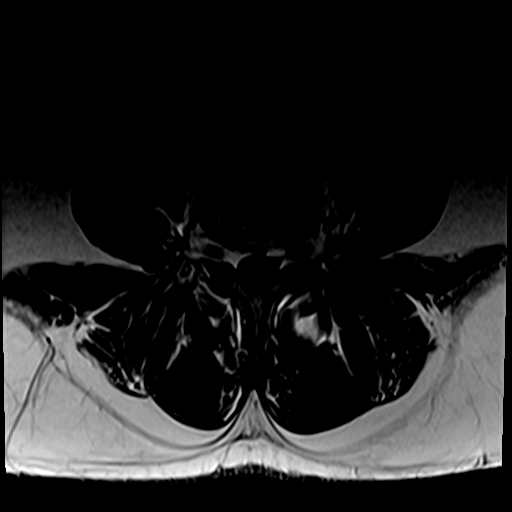
[im 17/36]
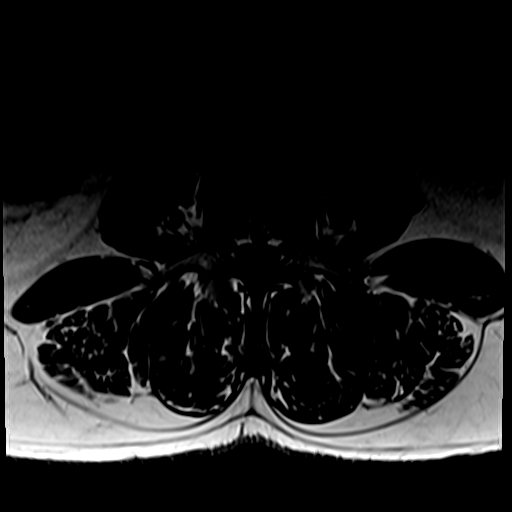
[im 19/36]
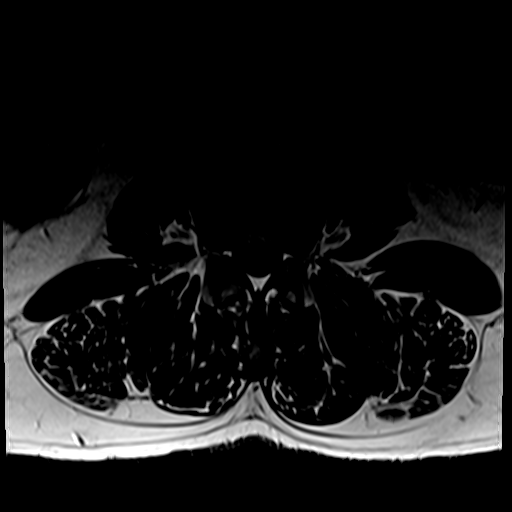
[im 25/36]
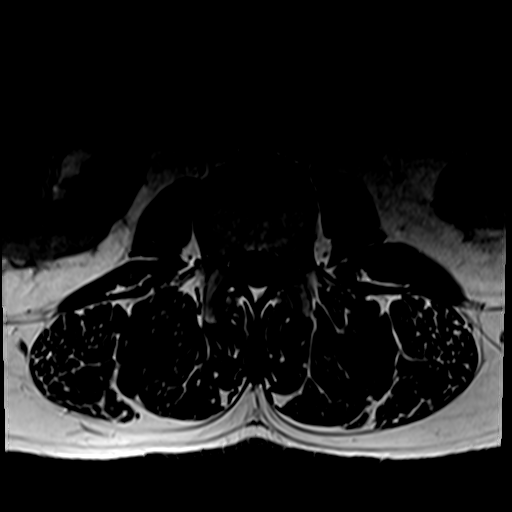
[im 30/36]
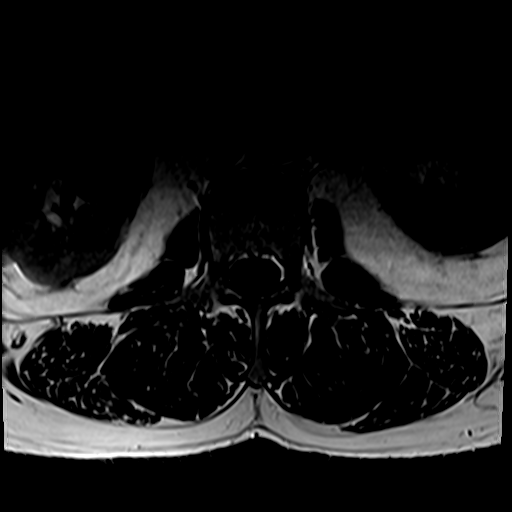
[im 36/36]
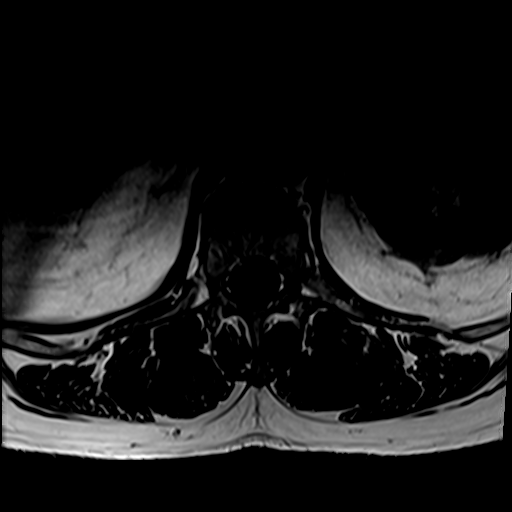

[31 of 48 positions shown; findings below may reference images not displayed]

FINDINGS: Segmentation:  Standard.

Alignment:  Stable.  No significant listhesis.

Vertebrae: Stable vertebral body heights. There is no significant
marrow edema or suspicious osseous lesion.

Conus medullaris and cauda equina: Conus extends to the T12-L1
level. Conus and cauda equina appear normal.

Paraspinal and other soft tissues: Unremarkable.

Disc levels: There is congenital narrowing of the spinal canal.

L1-L2:  No significant canal or foraminal stenosis.

L2-L3:  No significant canal or foraminal stenosis.

L3-L4: Disc bulge and mild facet arthropathy. Mild canal stenosis
with partial effacement lateral recesses. Minor foraminal stenosis.

L4-L5: Disc bulge and mild facet arthropathy. No significant canal
or right foraminal stenosis. Mild left foraminal stenosis.

L5-S1:  No significant canal or foraminal stenosis.
IMPRESSION: Mild multilevel degenerative changes, greatest at L3-L4.

## 2021-12-24 ENCOUNTER — Other Ambulatory Visit: Payer: Self-pay | Admitting: Student

## 2021-12-24 DIAGNOSIS — M5416 Radiculopathy, lumbar region: Secondary | ICD-10-CM

## 2021-12-25 ENCOUNTER — Ambulatory Visit
Admission: RE | Admit: 2021-12-25 | Discharge: 2021-12-25 | Disposition: A | Discharge status: Home / Self Care | Payer: BC Managed Care – PPO | Source: Ambulatory Visit | Attending: Student | Admitting: Student

## 2021-12-25 DIAGNOSIS — M5416 Radiculopathy, lumbar region: Secondary | ICD-10-CM | POA: Diagnosis not present

## 2022-03-26 ENCOUNTER — Ambulatory Visit: Payer: BC Managed Care – PPO | Attending: Neurosurgery

## 2022-03-26 DIAGNOSIS — M5459 Other low back pain: Secondary | ICD-10-CM | POA: Insufficient documentation

## 2022-03-26 DIAGNOSIS — M6281 Muscle weakness (generalized): Secondary | ICD-10-CM | POA: Insufficient documentation

## 2022-03-26 NOTE — Therapy (Unsigned)
OUTPATIENT PHYSICAL THERAPY THORACOLUMBAR EVALUATION  Patient Name: Jonathan Lester MRN: [id number] DOB:[birth year], 61 y.o., adult Today's Date: 03/28/2022  END OF SESSION:  PT End of Session - 03/28/22 0800     Visit Number 1    Number of Visits 17    Date for PT Re-Evaluation 05/21/22    Authorization Type eval: 03/26/22    PT Start Time 1531    PT Stop Time 1615    PT Time Calculation (min) 44 min    Activity Tolerance Patient tolerated treatment well    Behavior During Therapy WFL for tasks assessed/performed            Past Medical History:  Diagnosis Date   History of kidney stones    Hyperlipidemia    Hypertension    Sleep apnea    Past Surgical History:  Procedure Laterality Date   CHOLECYSTECTOMY     EXTRACORPOREAL SHOCK WAVE LITHOTRIPSY Right 01/18/2021   Procedure: EXTRACORPOREAL SHOCK WAVE LITHOTRIPSY (ESWL);  Surgeon: Royston Cowper, MD;  Location: [facility];  Service: Urology;  Laterality: Right;   EXTRACORPOREAL SHOCK WAVE LITHOTRIPSY Left 03/15/2021   Procedure: EXTRACORPOREAL SHOCK WAVE LITHOTRIPSY (ESWL);  Surgeon: Royston Cowper, MD;  Location: [facility];  Service: Urology;  Laterality: Left;   PLANTAR FASCIA RELEASE Left    PLANTAR FASCIA RELEASE Left 12/25/2018   Procedure: PLANTAR FASCIA RELEASE/[id number];  Surgeon: Sharlotte Alamo, DPM;  Location: [facility];  Service: Podiatry;  Laterality: Left;   ROTATOR CUFF REPAIR Bilateral    TONSILLECTOMY     Patient Active Problem List   Diagnosis Date Noted   Lumbar facet arthropathy 01/27/2020   Lumbar degenerative disc disease 01/27/2020   Chronic bilateral low back pain without sciatica 01/27/2020   Chronic pain syndrome 01/27/2020   Gastroesophageal reflux disease with esophagitis 05/28/2014   Intestinal metaplasia of gastric mucosa 05/28/2014   PCP: Adrian Prows MD  REFERRING PROVIDER: Earnie Larsson MD  REFERRING DIAG: M54.16 (ICD-10-CM) - Radiculopathy, lumbar region   RATIONALE FOR EVALUATION  AND TREATMENT: Rehabilitation  THERAPY DIAG: Other low back pain  Muscle weakness (generalized)  ONSET DATE: 30 years with worsening since 2021  FOLLOW-UP APPT SCHEDULED WITH REFERRING PROVIDER: Yes    SUBJECTIVE:                                                                                                                                                                                         SUBJECTIVE STATEMENT:  Low back pain  PERTINENT HISTORY:  Patient referred to physical therapy by neurosurgery due to chronic low back pain. He reports occasional low back aching x 30 years with  progressive worsening since 2021. He describes the pain as bilateral low back with occasional radiation into posterior hips and mid thighs.  Pain can occur bilaterally but typically does not typically happen on both sides at the same time.  He has had intermittent numbness into his right lateral and anterior thigh. He denies bowel or bladder dysfunction or saddle paresthesia.  Reports occasional lack of confidence and unsteadiness in his legs due to the pain but no true focal weakness. He has a neurologist, who prescribed him gabapentin. He was taking 300 mg t.i.d., but had to increase his does to 600 mg for short period. He is trying to get back down to 300 mg t.i.d. Gabapentin is really the only thing which has offered him relief. He has tried prednisone with minimal relief and has also had mild relief with chiropractic care.  He has also had epidural spinal injections without improvement in symptoms. Pt returns to see neurosurgeon on January 4th, 2024 and the plan is to schedule surgery in either late January or February. MRI is not currently available for review. Pt states that surgeon is planning to perform a fusion at L3-L4.  Lumbar MRI 12/25/21:  1. New central disc protrusion at L3-L4, which causes mild-to-moderate spinal canal stenosis and effaces the lateral recesses, likely compressing the descending L4  nerve roots. In addition, there is mild left neural foraminal narrowing at this level. 2. L4-L5 mild left-greater-than-right neural foraminal narrowing. Narrowing of the lateral recesses at this level could affect the descending L5 nerve roots. 3. Increased size of a left subarticular disc protrusion at L5-S1, which may contact the descending left S1 nerve roots. In addition, there is mild left neural foraminal narrowing at this level.  PAIN:    Pain Intensity: Present: 5/10, Worst: 10/10 Pain location: Left or right side (usually not both side), pain radiates into same side buttock. Mostly achy in the hips but sharp pain in the back Pain Quality: sharp in low back, achy in the hips Radiating: Yes, occasional radiation into posterior hips and mid thighs Numbness/Tingling: Yes, numbness and tingling into upper/mid thigh Focal Weakness: Yes, occasional unsteadiness/lack of confidence in legs; Aggravating factors: extended sitting, extended standing in one place, forward bending Relieving factors: gabapentin, no benefit from oral steroids, heat helps slight, no benefit from ice, position of comfort (leaning to one side) 24-hour pain behavior: varies based on activity but generally worse mid to late afternoon How long can you sit: a couple hours How long can you stand: 1 hour History of prior back injury, pain, surgery, or therapy: History of chronic back pain but no prior history of back surgery Dominant hand: right Imaging: Yes, lumbar MRI (see history) Red flags: Negative for bowel/bladder changes, saddle paresthesia, personal history of cancer, h/o spinal tumors, h/o compression fx, h/o abdominal aneurysm, abdominal pain, chills/fever, night sweats, nausea, vomiting, unrelenting pain, first onset of insidious LBP <21 y/o  PRECAUTIONS: None  WEIGHT BEARING RESTRICTIONS: No  FALLS: Has patient fallen in last 6 months? 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  Recent memory is intact.  Remote memory is intact.  Attention span and concentration are intact.  Expressive speech is intact.  Patient's fund of knowledge is within normal limits for educational level.    Gross Musculoskeletal Assessment Tremor: None Bulk: Normal Tone: Normal No visible step-off along spinal column, no signs of scoliosis  GAIT: Slightly slowed gait speed due to pain but full gait assessment deferred at this time.  Posture: Lumbar lordosis: WNL Iliac crest height: Equal bilaterally Lumbar lateral shift: Negative  AROM AROM (Normal range in degrees) AROM   Lumbar   Flexion (65) Severe limitation secondary to pain  Extension (30) Severe limitation secondary to pain  Right lateral flexion (25) Severe limitation secondary to pain  Left lateral flexion (25) Severe limitation secondary to pain  Right rotation (30) Severe limitation secondary to pain  Left rotation (30) Severe limitation secondary to pain      Hip Right Left  Flexion (125) WNL WNL  Extension (15)    Abduction (40)    Adduction     Internal Rotation (45) To be measured To be measured  External Rotation (45) To be measured To be measured      Knee    Flexion (135) WNL WNL  Extension (0) WNL WNL      Ankle    Dorsiflexion (20)    Plantarflexion (50)    Inversion (35)    Eversion (15)    (* = pain; Blank rows = not tested)  LE MMT: MMT (out of 5) Right  Left   Hip flexion  5 5  Hip extension 4+ 4+  Hip abduction    Hip adduction    Hip internal rotation 5 5  Hip external rotation 5 5  Knee flexion 4+ 4+  Knee extension 5 5  Ankle dorsiflexion 5 5  Ankle plantarflexion    Ankle inversion    Ankle eversion    (* = pain; Blank rows = not tested)  Sensation Grossly intact to light touch throughout bilateral LEs as determined by testing dermatomes L2-S2. Proprioception, stereognosis, and hot/cold testing deferred on this date.  Reflexes Deferred   Muscle Length Hamstrings: R: Positive for low back discomfort at 70 degrees L: Positive for shortness at 70 degrees [place] (quadriceps): R: Negative L: Negative Thomas (hip flexors): R: Negative L: Negative Ober: R: Negative L: Negative  Palpation Location Right Left         Lumbar paraspinals 1 1  Quadratus Lumborum 1 1  Gluteus Maximus 0 0  Gluteus Medius 0 0  Deep hip external rotators 0 0  PSIS 0 0  Fortin's Area (SIJ) 0 0  Greater Trochanter    (Blank rows = not tested) Graded on 0-4 scale (0 = no pain, 1 = pain, 2 = pain with wincing/grimacing/flinching, 3 = pain with withdrawal, 4 = unwilling to allow palpation)  Passive Accessory Intervertebral Motion Pt reports reproduction of low back pain with CPA L3-L5 and UPA bilaterally L3-L5. Generally, hypomobile throughout. No symptoms at L1-L2;  Special Tests Lumbar Radiculopathy and Discogenic: Centralization and Peripheralization (SN 92, -LR 0.12): Not examined Slump (SN 83, -LR 0.32): R: Negative L: Negative SLR (SN 92, -LR 0.29): R: Positive for low back discomfort at 70 degrees L:  Negative Crossed SLR (SP 90): R: Negative L: Negative  Facet Joint: Extension-Rotation (SN 100, -LR 0.0): R: Not examined L: Not examined  Lumbar Foraminal  Stenosis: Lumbar quadrant (SN 70): R: Negative L: Negative  Hip: FABER (SN 81): R: Negative L: Negative FADIR (SN 94): R: Negative L: Negative Hip scour (SN 50): R: Positive for mild low back pain L:  Negative  SIJ:  Thigh Thrust (SN 88, -LR 0.18) : R: Not examined L: Not examined  Piriformis Syndrome: FAIR Test (SN 88, SP 83): R: Not examined L: Not examined  Functional Tasks Deferred  Beighton scale: Deferred   TODAY'S TREATMENT: Deferred     PATIENT EDUCATION:  Education details: Plan of care and HEP Person educated: Patient Education method: Explanation, handout; Education comprehension: verbalized understanding   HOME EXERCISE PROGRAM:  Access Code: [email or web address] URL: [email or web address] Date: 03/27/2022 Prepared by: Ria Comment  Exercises - Hooklying Single Knee to Chest Stretch  - 2 x daily - 7 x weekly - 3 reps - 30s hold - Supine Lower Trunk Rotation  - 2 x daily - 7 x weekly - 2 sets - 10 reps - 3s hold - Supine Pelvic Tilt  - 2 x daily - 7 x weekly - 2 sets - 10 reps - 3s hold - Supine Active Straight Leg Raise  - 2 x daily - 7 x weekly - 2 sets - 10 reps - 3s hold   ASSESSMENT:  CLINICAL IMPRESSION: Patient is a 61 y.o. male who was seen today for physical therapy evaluation and treatment for chronic low back pain with worsening symptoms. MRI confirms central and lateral stenosis. Pertinent examination findings include severe loss of lumbar AROM secondary to highly irritable pain. Pain with palpation to bilateral lumbar musculature and positive R SLR at 70 degrees for reproduction of back pain. Positive reproduction of pain with CPA and bilateral UPA mobility testing L3-L5.  OBJECTIVE IMPAIRMENTS: decreased ROM and pain.   ACTIVITY LIMITATIONS: lifting, bending, sitting, and standing  PARTICIPATION LIMITATIONS: cleaning, community activity, occupation, and yard work  PERSONAL FACTORS: Age, Past/current experiences, Time since onset of injury/illness/exacerbation, and 1 comorbidity: OSA, and chronic pain  are also affecting patient's functional outcome.   REHAB POTENTIAL: Fair    CLINICAL DECISION MAKING:  Stable/uncomplicated  EVALUATION COMPLEXITY: Low   GOALS: Goals reviewed with patient? No  SHORT TERM GOALS: Target date: 04/23/2022  Pt will be independent with HEP in order to improve strength and decrease back pain to improve pain-free function at home and work. Baseline:  Goal status: INITIAL   LONG TERM GOALS: Target date: 05/21/2022  Pt will increase FOTO to at least 53 to demonstrate significant improvement in function at home and work related to back pain  Baseline: 03/26/22: 40 Goal status: INITIAL  2.  Pt will decrease worst back pain by at least 2 points on the NPRS in order to demonstrate clinically significant reduction in back pain. Baseline: 03/26/22: 10/10 Goal status: INITIAL  3.  Pt will decrease mODI score by at least 13 points in order demonstrate clinically significant reduction in back pain/disability.       Baseline: 03/26/22: To be completed Goal status: INITIAL  4.  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) and work (extended sitting) with less pain Baseline:  Goal status: INITIAL   PLAN: PT FREQUENCY: 1-2x/week  PT DURATION: 8 weeks  PLANNED INTERVENTIONS: Therapeutic exercises, Therapeutic activity, Neuromuscular re-education, Balance training, Gait training, Patient/Family education, Self Care, Joint mobilization, Joint manipulation, Vestibular training, Canalith repositioning, Orthotic/Fit training, DME instructions, Dry Needling, Electrical stimulation, Spinal  manipulation, Spinal mobilization, Cryotherapy, Moist heat, Taping, Traction, Ultrasound, Ionotophoresis 4mg /ml Dexamethasone, Manual therapy, and Re-evaluation.  PLAN FOR NEXT SESSION: Patient to complete modified ODI, initiate manual techniques, consider TDN, review/modify HEP as appropriate;   Lyndel Safe Soha Thorup PT, DPT, GCS  Satya Bohall, PT 03/28/2022, 8:00 AM

## 2022-03-28 ENCOUNTER — Ambulatory Visit: Payer: BC Managed Care – PPO

## 2022-03-28 DIAGNOSIS — M6281 Muscle weakness (generalized): Secondary | ICD-10-CM

## 2022-03-28 DIAGNOSIS — M5459 Other low back pain: Secondary | ICD-10-CM | POA: Diagnosis not present

## 2022-03-28 NOTE — Therapy (Signed)
OUTPATIENT PHYSICAL THERAPY THORACOLUMBAR TREATMENT  Patient Name: Jonathan Lester MRN: [id number] DOB:1960/10/03, 61 y.o., adult Today's Date: 04/02/2022  END OF SESSION:  PT End of Session - 04/02/22 1255     Visit Number 2    Number of Visits 17    Date for PT Re-Evaluation 05/21/22    Authorization Type eval: 03/26/22    PT Start Time 1615    PT Stop Time 1700    PT Time Calculation (min) 45 min    Activity Tolerance Patient tolerated treatment well    Behavior During Therapy [facility] for tasks assessed/performed            Past Medical History:  Diagnosis Date   History of kidney stones    Hyperlipidemia    Hypertension    Sleep apnea    Past Surgical History:  Procedure Laterality Date   CHOLECYSTECTOMY     EXTRACORPOREAL SHOCK WAVE LITHOTRIPSY Right 01/18/2021   Procedure: EXTRACORPOREAL SHOCK WAVE LITHOTRIPSY (ESWL);  Surgeon: Royston Cowper, MD;  Location: [facility] OR[facility];  Service: Urology;  Laterality: Right;   EXTRACORPOREAL SHOCK WAVE LITHOTRIPSY Left 03/15/2021   Procedure: EXTRACORPOREAL SHOCK WAVE LITHOTRIPSY (ESWL);  Surgeon: Royston Cowper, MD;  Location: [facility] OR[facility];  Service: Urology;  Laterality: Left;   PLANTAR FASCIA RELEASE Left    PLANTAR FASCIA RELEASE Left 12/25/2018   Procedure: PLANTAR FASCIA RELEASE/[id number];  Surgeon: Sharlotte Alamo, DPM;  Location: [facility];  Service: Podiatry;  Laterality: Left;   ROTATOR CUFF REPAIR Bilateral    TONSILLECTOMY     Patient Active Problem List   Diagnosis Date Noted   Lumbar facet arthropathy 01/27/2020   Lumbar degenerative disc disease 01/27/2020   Chronic bilateral low back pain without sciatica 01/27/2020   Chronic pain syndrome 01/27/2020   Gastroesophageal reflux disease with esophagitis 05/28/2014   Intestinal metaplasia of gastric mucosa 05/28/2014   PCP: Adrian Prows MD  REFERRING PROVIDER: Earnie Larsson MD  REFERRING DIAG: M54.16 (ICD-10-CM) - Radiculopathy, lumbar region   RATIONALE FOR EVALUATION  AND TREATMENT: Rehabilitation  THERAPY DIAG: Other low back pain  Muscle weakness (generalized)  ONSET DATE: 30 years with worsening since 2021  FOLLOW-UP APPT SCHEDULED WITH REFERRING PROVIDER: Yes   FROM INITIAL EVALUATION SUBJECTIVE:                                                                                                                                                                                         SUBJECTIVE STATEMENT:  Low back pain  PERTINENT HISTORY:  Patient referred to physical therapy by neurosurgery due to chronic low back pain. He reports occasional low back aching x 30  years with progressive worsening since 2021. He describes the pain as bilateral low back with occasional radiation into posterior hips and mid thighs.  Pain can occur bilaterally but typically does not typically happen on both sides at the same time.  He has had intermittent numbness into his right lateral and anterior thigh. He denies bowel or bladder dysfunction or saddle paresthesia.  Reports occasional lack of confidence and unsteadiness in his legs due to the pain but no true focal weakness. He has a neurologist, who prescribed him gabapentin. He was taking 300 mg t.i.d., but had to increase his does to 600 mg for short period. He is trying to get back down to 300 mg t.i.d. Gabapentin is really the only thing which has offered him relief. He has tried prednisone with minimal relief and has also had mild relief with chiropractic care.  He has also had epidural spinal injections without improvement in symptoms. Pt returns to see neurosurgeon on January 4th, 2024 and the plan is to schedule surgery in either late January or February. MRI is not currently available for review. Pt states that surgeon is planning to perform a fusion at L3-L4.  Lumbar MRI 12/25/21:  1. New central disc protrusion at L3-L4, which causes mild-to-moderate spinal canal stenosis and effaces the lateral recesses, likely  compressing the descending L4 nerve roots. In addition, there is mild left neural foraminal narrowing at this level. 2. L4-L5 mild left-greater-than-right neural foraminal narrowing. Narrowing of the lateral recesses at this level could affect the descending L5 nerve roots. 3. Increased size of a left subarticular disc protrusion at L5-S1, which may contact the descending left S1 nerve roots. In addition, there is mild left neural foraminal narrowing at this level.  PAIN:    Pain Intensity: Present: 5/10, Worst: 10/10 Pain location: Left or right side (usually not both side), pain radiates into same side buttock. Mostly achy in the hips but sharp pain in the back Pain Quality: sharp in low back, achy in the hips Radiating: Yes, occasional radiation into posterior hips and mid thighs Numbness/Tingling: Yes, numbness and tingling into upper/mid thigh Focal Weakness: Yes, occasional unsteadiness/lack of confidence in legs; Aggravating factors: extended sitting, extended standing in one place, forward bending Relieving factors: gabapentin, no benefit from oral steroids, heat helps slight, no benefit from ice, position of comfort (leaning to one side) 24-hour pain behavior: varies based on activity but generally worse mid to late afternoon How long can you sit: a couple hours How long can you stand: 1 hour History of prior back injury, pain, surgery, or therapy: History of chronic back pain but no prior history of back surgery Dominant hand: right Imaging: Yes, lumbar MRI (see history) Red flags: Negative for bowel/bladder changes, saddle paresthesia, personal history of cancer, h/o spinal tumors, h/o compression fx, h/o abdominal aneurysm, abdominal pain, chills/fever, night sweats, nausea, vomiting, unrelenting pain, first onset of insidious LBP <21 y/o  PRECAUTIONS: None  WEIGHT BEARING RESTRICTIONS: No  FALLS: Has patient fallen in last 6 months? 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  Recent memory is intact.  Remote memory is intact.  Attention span and concentration are intact.  Expressive speech is intact.  Patient's fund of knowledge is within normal limits for educational level.    Gross Musculoskeletal Assessment Tremor: None Bulk: Normal Tone: Normal No visible step-off along spinal column, no signs of scoliosis  GAIT: Slightly slowed gait speed due to pain but full gait assessment deferred at this time.  Posture: Lumbar lordosis: WNL Iliac crest height: Equal bilaterally Lumbar lateral shift: Negative  AROM AROM (Normal range in degrees) AROM   Lumbar   Flexion (65) Severe limitation secondary to pain  Extension (30) Severe limitation secondary to pain  Right lateral flexion (25) Severe limitation secondary to pain  Left lateral flexion (25) Severe limitation secondary to pain  Right rotation (30) Severe limitation secondary to pain  Left rotation (30) Severe limitation secondary to pain      Hip Right Left  Flexion (125) WNL WNL  Extension (15)    Abduction (40)    Adduction     Internal Rotation (45) To be measured To be measured  External Rotation (45) To be measured To be measured      Knee    Flexion (135) WNL WNL  Extension (0) WNL WNL      Ankle    Dorsiflexion (20)    Plantarflexion (50)    Inversion (35)    Eversion (15)    (* = pain; Blank rows = not tested)  LE MMT: MMT (out  of 5) Right  Left   Hip flexion 5 5  Hip extension 4+ 4+  Hip abduction    Hip adduction    Hip internal rotation 5 5  Hip external rotation 5 5  Knee flexion 4+ 4+  Knee extension 5 5  Ankle dorsiflexion 5 5  Ankle plantarflexion    Ankle inversion    Ankle eversion    (* = pain; Blank rows = not tested)  Sensation Grossly intact to light touch throughout bilateral LEs as determined by testing dermatomes L2-S2. Proprioception, stereognosis, and hot/cold testing deferred on this date.  Reflexes Deferred   Muscle Length Hamstrings: R: Positive for low back discomfort at 70 degrees L: Positive for shortness at 70 degrees [place] (quadriceps): R: Negative L: Negative Thomas (hip flexors): R: Negative L: Negative Ober: R: Negative L: Negative  Palpation Location Right Left         Lumbar paraspinals 1 1  Quadratus Lumborum 1 1  Gluteus Maximus 0 0  Gluteus Medius 0 0  Deep hip external rotators 0 0  PSIS 0 0  Fortin's Area (SIJ) 0 0  Greater Trochanter    (Blank rows = not tested) Graded on 0-4 scale (0 = no pain, 1 = pain, 2 = pain with wincing/grimacing/flinching, 3 = pain with withdrawal, 4 = unwilling to allow palpation)  Passive Accessory Intervertebral Motion Pt reports reproduction of low back pain with CPA L3-L5 and UPA bilaterally L3-L5. Generally, hypomobile throughout. No symptoms at L1-L2;  Special Tests Lumbar Radiculopathy and Discogenic: Centralization and Peripheralization (SN 92, -LR 0.12): Not examined Slump (SN 83, -LR 0.32): R: Negative L: Negative SLR (SN 92, -LR 0.29): R: Positive for low back discomfort at 70 degrees L:  Negative Crossed SLR (SP 90): R: Negative L: Negative  Facet Joint: Extension-Rotation (SN 100, -LR 0.0): R: Not examined L: Not examined  Lumbar Foraminal Stenosis: Lumbar quadrant (SN 70): R: Negative L: Negative  Hip: FABER (SN 81): R: Negative L: Negative FADIR (SN 94): R: Negative L: Negative Hip scour (SN 50): R:  Positive for mild low back pain L: Negative  SIJ:  Thigh Thrust (SN 88, -LR 0.18) : R: Not examined L: Not examined  Piriformis Syndrome: FAIR Test (SN 88, SP 83): R: Not examined L: Not examined  Functional Tasks Deferred  Beighton scale: Deferred   TODAY'S TREATMENT:  SUBJECTIVE: Pt reports that he is doing well today. He states that he noticed increase in his pain during single knee to chest on the L side as well as with anterior pelvic tilts. Otherwise no specific questions currently.   PAIN: 3-4/10 bilateral low back pain;   Manual Therapy  Supine single knee to chest stretch x 30s bilateral, stopped after about 15s on LLE due to increase in pain; Gentle hip FABER and FADIR stretches x 30s each bilateral; Supine hamstring strength to onset of low back symptoms followed by gentle ankle PF/DF for sciatic nerve tensioning x multiple bouts; Belt assisted long axis hip distraction 2 x 30s bilaterally; Belt assisted hip medial to lateral mobilizations at 45 degrees flexion, grade II-III, 30s/bout x 2 bouts BLE Belt assisted hip inferior mobilizations at 90 flexion, grade II-III, 30s/bout x 2 bouts BLE; Prone light STM to bilateral lumbar paraspinals;   Electrical Stimulation  With pt prone, IFC applied to bilateral low back with [employer] unit using default settings at pt tolerated intensity of 7.6V (without contraction). Utilized concurrent moist heat to lower lumbar spine x 10 minutes.   Ther-ex  Hooklying posterior pelvic tilts 3s hold x 10, discontinued anterior tilts; Hooklying lumbar rotation rocking x 30s, modified to limit range of motion as pt was pushing through pain as far as he could rotate; Modified and reviewed HEP;   PATIENT EDUCATION:  Education details: Plan of care and HEP Person educated: Patient Education method: Explanation, handout; Education comprehension: verbalized understanding   HOME EXERCISE PROGRAM:  Access Code: [email or web address] URL:  [email or web address] Date: 04/02/2022 Prepared by: Roxana Hires  Exercises - Hooklying Single Knee to Chest Stretch  - 2 x daily - 7 x weekly - 3 reps - 30s hold - Supine Lower Trunk Rotation  - 2 x daily - 7 x weekly - 2 sets - 10 reps - 3s hold - Supine Active Straight Leg Raise  - 2 x daily - 7 x weekly - 2 sets - 10 reps - 3s hold - Supine Posterior Pelvic Tilt  - 2 x daily - 7 x weekly - 2 sets - 10 reps - 3s hold   ASSESSMENT:  CLINICAL IMPRESSION: Patient reports increase in his low back pain during anterior pelvic tilts so discontinued during session today and pt advised only to perform posterior tilts. Also modified hooklying lumbar rotation rocking to only move through limited range of motion with minimal to no increase in pain. Initiated manual techniques with patient during session today including lumbar distraction through hip mobilizations as well as soft tissue mobilization. Modified HEP and pt encouraged to follow-up as scheduled. Pt will benefit from PT services to address deficits in pain and strength in order to return to full function at home.   OBJECTIVE IMPAIRMENTS: decreased ROM and pain.   ACTIVITY LIMITATIONS: lifting, bending, sitting, and standing  PARTICIPATION LIMITATIONS: cleaning, community activity, occupation, and yard work  PERSONAL FACTORS: Age, Past/current experiences, Time since onset of injury/illness/exacerbation, and 1 comorbidity:  OSA, and chronic pain  are also affecting patient's functional outcome.   REHAB POTENTIAL: Fair    CLINICAL DECISION MAKING: Stable/uncomplicated  EVALUATION COMPLEXITY: Low   GOALS: Goals reviewed with patient? No  SHORT TERM GOALS: Target date: 04/23/2022  Pt will be independent with HEP in order to improve strength and decrease back pain to improve pain-free function at home and work. Baseline:  Goal status: INITIAL   LONG TERM GOALS: Target date: 05/21/2022  Pt will increase FOTO to at  least 53 to demonstrate significant improvement in function at home and work related to back pain  Baseline: 03/26/22: 40 Goal status: INITIAL  2.  Pt will decrease worst back pain by at least 2 points on the NPRS in order to demonstrate clinically significant reduction in back pain. Baseline: 03/26/22: 10/10 Goal status: INITIAL  3.  Pt will decrease mODI score by at least 13 points in order demonstrate clinically significant reduction in back pain/disability.       Baseline: 03/26/22: To be completed Goal status: INITIAL  4.  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) and work (extended sitting) with less pain Baseline:  Goal status: INITIAL   PLAN: PT FREQUENCY: 1-2x/week  PT DURATION: 8 weeks  PLANNED INTERVENTIONS: Therapeutic exercises, Therapeutic activity, Neuromuscular re-education, Balance training, Gait training, Patient/Family education, Self Care, Joint mobilization, Joint manipulation, Vestibular training, Canalith repositioning, Orthotic/Fit training, DME instructions, Dry Needling, Electrical stimulation, Spinal manipulation, Spinal mobilization, Cryotherapy, Moist heat, Taping, Traction, Ultrasound, Ionotophoresis 4mg /ml Dexamethasone, Manual therapy, and Re-evaluation.  PLAN FOR NEXT SESSION: Patient to complete modified ODI, assess response to estim, continue manual techniques, consider TDN, review/modify HEP as appropriate;   Valorie Mcgrory PT, DPT, GCS  Jonathan Lester, PT 04/02/2022, 1:07 PM

## 2022-03-29 ENCOUNTER — Other Ambulatory Visit: Payer: Self-pay | Admitting: Neurology

## 2022-03-29 DIAGNOSIS — M542 Cervicalgia: Secondary | ICD-10-CM

## 2022-03-29 DIAGNOSIS — R519 Headache, unspecified: Secondary | ICD-10-CM

## 2022-04-02 ENCOUNTER — Ambulatory Visit: Payer: BC Managed Care – PPO

## 2022-04-02 DIAGNOSIS — M6281 Muscle weakness (generalized): Secondary | ICD-10-CM

## 2022-04-02 DIAGNOSIS — M5459 Other low back pain: Secondary | ICD-10-CM

## 2022-04-02 NOTE — Therapy (Signed)
OUTPATIENT PHYSICAL THERAPY THORACOLUMBAR TREATMENT  Patient Name: Jonathan Lester MRN: [id number] DOB:1960-11-22, 61 y.o., adult Today's Date: 04/03/2022  END OF SESSION:  PT End of Session - 04/02/22 1533     Visit Number 3    Number of Visits 17    Date for PT Re-Evaluation 05/21/22    Authorization Type eval: 03/26/22    PT Start Time 1534    PT Stop Time 1615    PT Time Calculation (min) 41 min    Activity Tolerance Patient tolerated treatment well    Behavior During Therapy WFL for tasks assessed/performed            Past Medical History:  Diagnosis Date   History of kidney stones    Hyperlipidemia    Hypertension    Sleep apnea    Past Surgical History:  Procedure Laterality Date   CHOLECYSTECTOMY     EXTRACORPOREAL SHOCK WAVE LITHOTRIPSY Right 01/18/2021   Procedure: EXTRACORPOREAL SHOCK WAVE LITHOTRIPSY (ESWL);  Surgeon: Orson Ape, MD;  Location: [facility];  Service: Urology;  Laterality: Right;   EXTRACORPOREAL SHOCK WAVE LITHOTRIPSY Left 03/15/2021   Procedure: EXTRACORPOREAL SHOCK WAVE LITHOTRIPSY (ESWL);  Surgeon: Orson Ape, MD;  Location: [facility];  Service: Urology;  Laterality: Left;   PLANTAR FASCIA RELEASE Left    PLANTAR FASCIA RELEASE Left 12/25/2018   Procedure: PLANTAR FASCIA RELEASE/[id number];  Surgeon: Linus Galas, DPM;  Location: [facility];  Service: Podiatry;  Laterality: Left;   ROTATOR CUFF REPAIR Bilateral    TONSILLECTOMY     Patient Active Problem List   Diagnosis Date Noted   Lumbar facet arthropathy 01/27/2020   Lumbar degenerative disc disease 01/27/2020   Chronic bilateral low back pain without sciatica 01/27/2020   Chronic pain syndrome 01/27/2020   Gastroesophageal reflux disease with esophagitis 05/28/2014   Intestinal metaplasia of gastric mucosa 05/28/2014   PCP: Clydie Braun MD  REFERRING PROVIDER: Julio Sicks MD  REFERRING DIAG: M54.16 (ICD-10-CM) - Radiculopathy, lumbar region   RATIONALE FOR EVALUATION  AND TREATMENT: Rehabilitation  THERAPY DIAG: Other low back pain  Muscle weakness (generalized)  ONSET DATE: 30 years with worsening since 2021  FOLLOW-UP APPT SCHEDULED WITH REFERRING PROVIDER: Yes   FROM INITIAL EVALUATION SUBJECTIVE:                                                                                                                                                                                         SUBJECTIVE STATEMENT:  Low back pain  PERTINENT HISTORY:  Patient referred to physical therapy by neurosurgery due to chronic low back pain. He reports occasional low back aching x 30  years with progressive worsening since 2021. He describes the pain as bilateral low back with occasional radiation into posterior hips and mid thighs.  Pain can occur bilaterally but typically does not typically happen on both sides at the same time.  He has had intermittent numbness into his right lateral and anterior thigh. He denies bowel or bladder dysfunction or saddle paresthesia.  Reports occasional lack of confidence and unsteadiness in his legs due to the pain but no true focal weakness. He has a neurologist, who prescribed him gabapentin. He was taking 300 mg t.i.d., but had to increase his does to 600 mg for short period. He is trying to get back down to 300 mg t.i.d. Gabapentin is really the only thing which has offered him relief. He has tried prednisone with minimal relief and has also had mild relief with chiropractic care.  He has also had epidural spinal injections without improvement in symptoms. Pt returns to see neurosurgeon on January 4th, 2024 and the plan is to schedule surgery in either late January or February. MRI is not currently available for review. Pt states that surgeon is planning to perform a fusion at L3-L4.  Lumbar MRI 12/25/21:  1. New central disc protrusion at L3-L4, which causes mild-to-moderate spinal canal stenosis and effaces the lateral recesses, likely  compressing the descending L4 nerve roots. In addition, there is mild left neural foraminal narrowing at this level. 2. L4-L5 mild left-greater-than-right neural foraminal narrowing. Narrowing of the lateral recesses at this level could affect the descending L5 nerve roots. 3. Increased size of a left subarticular disc protrusion at L5-S1, which may contact the descending left S1 nerve roots. In addition, there is mild left neural foraminal narrowing at this level.  PAIN:    Pain Intensity: Present: 5/10, Worst: 10/10 Pain location: Left or right side (usually not both side), pain radiates into same side buttock. Mostly achy in the hips but sharp pain in the back Pain Quality: sharp in low back, achy in the hips Radiating: Yes, occasional radiation into posterior hips and mid thighs Numbness/Tingling: Yes, numbness and tingling into upper/mid thigh Focal Weakness: Yes, occasional unsteadiness/lack of confidence in legs; Aggravating factors: extended sitting, extended standing in one place, forward bending Relieving factors: gabapentin, no benefit from oral steroids, heat helps slight, no benefit from ice, position of comfort (leaning to one side) 24-hour pain behavior: varies based on activity but generally worse mid to late afternoon How long can you sit: a couple hours How long can you stand: 1 hour History of prior back injury, pain, surgery, or therapy: History of chronic back pain but no prior history of back surgery Dominant hand: right Imaging: Yes, lumbar MRI (see history) Red flags: Negative for bowel/bladder changes, saddle paresthesia, personal history of cancer, h/o spinal tumors, h/o compression fx, h/o abdominal aneurysm, abdominal pain, chills/fever, night sweats, nausea, vomiting, unrelenting pain, first onset of insidious LBP <60 y/o  PRECAUTIONS: None  WEIGHT BEARING RESTRICTIONS: No  FALLS: Has patient fallen in last 6 months? 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  Recent memory is intact.  Remote memory is intact.  Attention span and concentration are intact.  Expressive speech is intact.  Patient's fund of knowledge is within normal limits for educational level.    Gross Musculoskeletal Assessment Tremor: None Bulk: Normal Tone: Normal No visible step-off along spinal column, no signs of scoliosis  GAIT: Slightly slowed gait speed due to pain but full gait assessment deferred at this time.  Posture: Lumbar lordosis: WNL Iliac crest height: Equal bilaterally Lumbar lateral shift: Negative  AROM AROM (Normal range in degrees) AROM   Lumbar   Flexion (65) Severe limitation secondary to pain  Extension (30) Severe limitation secondary to pain  Right lateral flexion (25) Severe limitation secondary to pain  Left lateral flexion (25) Severe limitation secondary to pain  Right rotation (30) Severe limitation secondary to pain  Left rotation (30) Severe limitation secondary to pain      Hip Right Left  Flexion (125) WNL WNL  Extension (15)    Abduction (40)    Adduction     Internal Rotation (45) To be measured To be measured  External Rotation (45) To be measured To be measured      Knee    Flexion (135) WNL WNL  Extension (0) WNL WNL      Ankle    Dorsiflexion (20)    Plantarflexion (50)    Inversion (35)    Eversion (15)    (* = pain; Blank rows = not tested)  LE MMT: MMT (out  of 5) Right  Left   Hip flexion 5 5  Hip extension 4+ 4+  Hip abduction    Hip adduction    Hip internal rotation 5 5  Hip external rotation 5 5  Knee flexion 4+ 4+  Knee extension 5 5  Ankle dorsiflexion 5 5  Ankle plantarflexion    Ankle inversion    Ankle eversion    (* = pain; Blank rows = not tested)  Sensation Grossly intact to light touch throughout bilateral LEs as determined by testing dermatomes L2-S2. Proprioception, stereognosis, and hot/cold testing deferred on this date.  Reflexes Deferred   Muscle Length Hamstrings: R: Positive for low back discomfort at 70 degrees L: Positive for shortness at 70 degrees [place] (quadriceps): R: Negative L: Negative Thomas (hip flexors): R: Negative L: Negative Ober: R: Negative L: Negative  Palpation Location Right Left         Lumbar paraspinals 1 1  Quadratus Lumborum 1 1  Gluteus Maximus 0 0  Gluteus Medius 0 0  Deep hip external rotators 0 0  PSIS 0 0  Fortin's Area (SIJ) 0 0  Greater Trochanter    (Blank rows = not tested) Graded on 0-4 scale (0 = no pain, 1 = pain, 2 = pain with wincing/grimacing/flinching, 3 = pain with withdrawal, 4 = unwilling to allow palpation)  Passive Accessory Intervertebral Motion Pt reports reproduction of low back pain with CPA L3-L5 and UPA bilaterally L3-L5. Generally, hypomobile throughout. No symptoms at L1-L2;  Special Tests Lumbar Radiculopathy and Discogenic: Centralization and Peripheralization (SN 92, -LR 0.12): Not examined Slump (SN 83, -LR 0.32): R: Negative L: Negative SLR (SN 92, -LR 0.29): R: Positive for low back discomfort at 70 degrees L:  Negative Crossed SLR (SP 90): R: Negative L: Negative  Facet Joint: Extension-Rotation (SN 100, -LR 0.0): R: Not examined L: Not examined  Lumbar Foraminal Stenosis: Lumbar quadrant (SN 70): R: Negative L: Negative  Hip: FABER (SN 81): R: Negative L: Negative FADIR (SN 94): R: Negative L: Negative Hip scour (SN 50): R:  Positive for mild low back pain L: Negative  SIJ:  Thigh Thrust (SN 88, -LR 0.18) : R: Not examined L: Not examined  Piriformis Syndrome: FAIR Test (SN 88, SP 83): R: Not examined L: Not examined  Functional Tasks Deferred  Beighton scale: Deferred   TODAY'S TREATMENT:   SUBJECTIVE: Pt reports that he is doing alright today. No significant changes since the last therapy session. No improvement in symptoms after the last session and no relief from TENS.. No specific questions currently.   PAIN: 3/10 right low back pain;   Manual Therapy  Gentle hip FABER and FADIR stretches x 30s each bilateral; Supine hamstring strength to onset of low back symptoms followed by gentle ankle PF/DF for sciatic nerve tensioning x multiple bouts bilaterally; Belt assisted long axis hip distraction 2 x 30s bilaterally; Belt assisted hip medial to lateral mobilizations at 45 degrees flexion, grade II-III, 30s/bout x 2 bouts BLE; Belt assisted hip inferior mobilizations at 90 flexion, grade II-III, 30s/bout x 2 bouts BLE;  . Ther-ex  Hooklying posterior pelvic tilts 3s hold x 10; Hooklying lumbar rotation rocking 2 x 30s; Hooklying posterior tilt with alternating marches x 10 BLE; Supine SLR x 10 BLE; Hooklying clams with green tband x 10; Hooklying adductor squeeze with manual resistance x 10; Updated and reviewed HEP;   PATIENT EDUCATION:  Education details: Plan of care and HEP Person educated: Patient Education method: Explanation, handout; Education comprehension: verbalized understanding   HOME EXERCISE PROGRAM:  Access Code: [email or web address] URL: [email or web address] Date: 04/02/2022 Prepared by: Ria Comment  Exercises - Hooklying Single Knee to Chest Stretch  - 2 x daily - 7 x weekly - 3 reps - 30s hold - Supine Lower Trunk Rotation  - 2 x daily - 7 x weekly - 2 sets - 10 reps - 3s hold - Supine Active Straight Leg Raise  - 2 x daily - 7 x weekly - 2 sets - 10 reps -  3s hold - Supine Posterior Pelvic Tilt  - 2 x daily - 7 x weekly - 2 sets - 10 reps - 3s hold   ASSESSMENT:  CLINICAL IMPRESSION: Patient demonstrates excellent motivation during session today. Continued manual techniques with patient during session today including lumbar distraction through hip mobilizations as well as soft tissue mobilization. He reports relief of pain with long axis hip distraction. Updated HEP and pt encouraged to follow-up as scheduled. Pt will benefit from PT services to address deficits in pain and strength in order to return to full function at home.   OBJECTIVE IMPAIRMENTS: decreased ROM and pain.   ACTIVITY LIMITATIONS: lifting, bending, sitting, and standing  PARTICIPATION LIMITATIONS: cleaning, community activity, occupation, and yard work  PERSONAL FACTORS: Age, Past/current experiences, Time since onset of injury/illness/exacerbation, and 1 comorbidity: OSA, and chronic pain  are also affecting patient's functional outcome.   REHAB POTENTIAL: Fair    CLINICAL DECISION MAKING: Stable/uncomplicated  EVALUATION COMPLEXITY: Low   GOALS: Goals reviewed with patient? No  SHORT TERM GOALS: Target date: 04/23/2022  Pt will be independent with HEP in order to improve strength and decrease back pain to improve pain-free function at home and work. Baseline:  Goal status: INITIAL   LONG TERM GOALS: Target date: 05/21/2022  Pt will increase FOTO to at least 53 to demonstrate  significant improvement in function at home and work related to back pain  Baseline: 03/26/22: 40 Goal status: INITIAL  2.  Pt will decrease worst back pain by at least 2 points on the NPRS in order to demonstrate clinically significant reduction in back pain. Baseline: 03/26/22: 10/10 Goal status: INITIAL  3.  Pt will decrease mODI score by at least 13 points in order demonstrate clinically significant reduction in back pain/disability.       Baseline: 03/26/22: To be completed Goal  status: INITIAL  4.  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) and work (extended sitting) with less pain Baseline:  Goal status: INITIAL   PLAN: PT FREQUENCY: 1-2x/week  PT DURATION: 8 weeks  PLANNED INTERVENTIONS: Therapeutic exercises, Therapeutic activity, Neuromuscular re-education, Balance training, Gait training, Patient/Family education, Self Care, Joint mobilization, Joint manipulation, Vestibular training, Canalith repositioning, Orthotic/Fit training, DME instructions, Dry Needling, Electrical stimulation, Spinal manipulation, Spinal mobilization, Cryotherapy, Moist heat, Taping, Traction, Ultrasound, Ionotophoresis 4mg /ml Dexamethasone, Manual therapy, and Re-evaluation.  PLAN FOR NEXT SESSION: Patient to complete modified ODI, continue manual techniques, consider TDN to lumbar paraspinals, review/modify HEP as appropriate;   Tris Howell PT, DPT, GCS  Vernona Peake, PT 04/03/2022, 11:00 PM

## 2022-04-04 ENCOUNTER — Ambulatory Visit: Payer: BC Managed Care – PPO

## 2022-04-04 NOTE — Therapy (Incomplete)
OUTPATIENT PHYSICAL THERAPY THORACOLUMBAR TREATMENT  Patient Name: Jonathan Lester MRN: [id number] DOB:07-07-1960, 61 y.o., adult Today's Date: 04/04/2022  END OF SESSION:   Past Medical History:  Diagnosis Date   History of kidney stones    Hyperlipidemia    Hypertension    Sleep apnea    Past Surgical History:  Procedure Laterality Date   CHOLECYSTECTOMY     EXTRACORPOREAL SHOCK WAVE LITHOTRIPSY Right 01/18/2021   Procedure: EXTRACORPOREAL SHOCK WAVE LITHOTRIPSY (ESWL);  Surgeon: Orson Ape, MD;  Location: [facility];  Service: Urology;  Laterality: Right;   EXTRACORPOREAL SHOCK WAVE LITHOTRIPSY Left 03/15/2021   Procedure: EXTRACORPOREAL SHOCK WAVE LITHOTRIPSY (ESWL);  Surgeon: Orson Ape, MD;  Location: [facility];  Service: Urology;  Laterality: Left;   PLANTAR FASCIA RELEASE Left    PLANTAR FASCIA RELEASE Left 12/25/2018   Procedure: PLANTAR FASCIA RELEASE/[id number];  Surgeon: Linus Galas, DPM;  Location: [facility];  Service: Podiatry;  Laterality: Left;   ROTATOR CUFF REPAIR Bilateral    TONSILLECTOMY     Patient Active Problem List   Diagnosis Date Noted   Lumbar facet arthropathy 01/27/2020   Lumbar degenerative disc disease 01/27/2020   Chronic bilateral low back pain without sciatica 01/27/2020   Chronic pain syndrome 01/27/2020   Gastroesophageal reflux disease with esophagitis 05/28/2014   Intestinal metaplasia of gastric mucosa 05/28/2014   PCP: Clydie Braun MD  REFERRING PROVIDER: Julio Sicks MD  REFERRING DIAG: M54.16 (ICD-10-CM) - Radiculopathy, lumbar region   RATIONALE FOR EVALUATION AND TREATMENT: Rehabilitation  THERAPY DIAG: Other low back pain  Muscle weakness (generalized)  ONSET DATE: 30 years with worsening since 2021  FOLLOW-UP APPT SCHEDULED WITH REFERRING PROVIDER: Yes   FROM INITIAL EVALUATION SUBJECTIVE:                                                                                                                                                                                          SUBJECTIVE STATEMENT:  Low back pain  PERTINENT HISTORY:  Patient referred to physical therapy by neurosurgery due to chronic low back pain. He reports occasional low back aching x 30 years with progressive worsening since 2021. He describes the pain as bilateral low back with occasional radiation into posterior hips and mid thighs.  Pain can occur bilaterally but typically does not typically happen on both sides at the same time.  He has had intermittent numbness into his right lateral and anterior thigh. He denies bowel or bladder dysfunction or saddle paresthesia.  Reports occasional lack of confidence and unsteadiness in his legs due to the pain but no true focal weakness. He has a neurologist, who  prescribed him gabapentin. He was taking 300 mg t.i.d., but had to increase his does to 600 mg for short period. He is trying to get back down to 300 mg t.i.d. Gabapentin is really the only thing which has offered him relief. He has tried prednisone with minimal relief and has also had mild relief with chiropractic care.  He has also had epidural spinal injections without improvement in symptoms. Pt returns to see neurosurgeon on January 4th, 2024 and the plan is to schedule surgery in either late January or February. MRI is not currently available for review. Pt states that surgeon is planning to perform a fusion at L3-L4.  Lumbar MRI 12/25/21:  1. New central disc protrusion at L3-L4, which causes mild-to-moderate spinal canal stenosis and effaces the lateral recesses, likely compressing the descending L4 nerve roots. In addition, there is mild left neural foraminal narrowing at this level. 2. L4-L5 mild left-greater-than-right neural foraminal narrowing. Narrowing of the lateral recesses at this level could affect the descending L5 nerve roots. 3. Increased size of a left subarticular disc protrusion at L5-S1, which may contact the descending  left S1 nerve roots. In addition, there is mild left neural foraminal narrowing at this level.  PAIN:    Pain Intensity: Present: 5/10, Worst: 10/10 Pain location: Left or right side (usually not both side), pain radiates into same side buttock. Mostly achy in the hips but sharp pain in the back Pain Quality: sharp in low back, achy in the hips Radiating: Yes, occasional radiation into posterior hips and mid thighs Numbness/Tingling: Yes, numbness and tingling into upper/mid thigh Focal Weakness: Yes, occasional unsteadiness/lack of confidence in legs; Aggravating factors: extended sitting, extended standing in one place, forward bending Relieving factors: gabapentin, no benefit from oral steroids, heat helps slight, no benefit from ice, position of comfort (leaning to one side) 24-hour pain behavior: varies based on activity but generally worse mid to late afternoon How long can you sit: a couple hours How long can you stand: 1 hour History of prior back injury, pain, surgery, or therapy: History of chronic back pain but no prior history of back surgery Dominant hand: right Imaging: Yes, lumbar MRI (see history) Red flags: Negative for bowel/bladder changes, saddle paresthesia, personal history of cancer, h/o spinal tumors, h/o compression fx, h/o abdominal aneurysm, abdominal pain, chills/fever, night sweats, nausea, vomiting, unrelenting pain, first onset of insidious LBP <21 y/o  PRECAUTIONS: None  WEIGHT BEARING RESTRICTIONS: No  FALLS: Has patient fallen in last 6 months? 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  Recent memory is intact.  Remote memory is intact.  Attention span and concentration are intact.  Expressive speech is intact.  Patient's fund of knowledge is within normal limits for educational level.    Gross Musculoskeletal Assessment Tremor: None Bulk: Normal Tone: Normal No visible step-off along spinal column, no signs of scoliosis  GAIT: Slightly slowed gait speed due to pain but full gait assessment deferred at this  time.  Posture: Lumbar lordosis: WNL Iliac crest height: Equal bilaterally Lumbar lateral shift: Negative  AROM AROM (Normal range in degrees) AROM   Lumbar   Flexion (65) Severe limitation secondary to pain  Extension (30) Severe limitation secondary to pain  Right lateral flexion (25) Severe limitation secondary to pain  Left lateral flexion (25) Severe limitation secondary to pain  Right rotation (30) Severe limitation secondary to pain  Left rotation (30) Severe limitation secondary to pain      Hip Right Left  Flexion (125) WNL WNL  Extension (15)    Abduction (40)    Adduction     Internal Rotation (45) To be measured To be measured  External Rotation (45) To be measured To be measured      Knee    Flexion (135) WNL WNL  Extension (0) WNL WNL      Ankle    Dorsiflexion (20)    Plantarflexion (50)    Inversion (35)    Eversion (15)    (* = pain; Blank rows = not tested)  LE MMT: MMT (out of 5) Right  Left   Hip flexion 5 5  Hip extension 4+ 4+  Hip abduction    Hip adduction    Hip internal rotation 5 5  Hip external rotation 5 5  Knee flexion 4+ 4+  Knee extension 5 5  Ankle dorsiflexion 5 5  Ankle plantarflexion    Ankle inversion    Ankle eversion    (* = pain; Blank rows = not tested)  Sensation Grossly intact to light touch throughout  bilateral LEs as determined by testing dermatomes L2-S2. Proprioception, stereognosis, and hot/cold testing deferred on this date.  Reflexes Deferred   Muscle Length Hamstrings: R: Positive for low back discomfort at 70 degrees L: Positive for shortness at 70 degrees [place] (quadriceps): R: Negative L: Negative Thomas (hip flexors): R: Negative L: Negative Ober: R: Negative L: Negative  Palpation Location Right Left         Lumbar paraspinals 1 1  Quadratus Lumborum 1 1  Gluteus Maximus 0 0  Gluteus Medius 0 0  Deep hip external rotators 0 0  PSIS 0 0  Fortin's Area (SIJ) 0 0  Greater Trochanter    (Blank rows = not tested) Graded on 0-4 scale (0 = no pain, 1 = pain, 2 = pain with wincing/grimacing/flinching, 3 = pain with withdrawal, 4 = unwilling to allow palpation)  Passive Accessory Intervertebral Motion Pt reports reproduction of low back pain with CPA L3-L5 and UPA bilaterally L3-L5. Generally, hypomobile throughout. No symptoms at L1-L2;  Special Tests Lumbar Radiculopathy and Discogenic: Centralization and Peripheralization (SN 92, -LR 0.12): Not examined Slump (SN 83, -LR 0.32): R: Negative L: Negative SLR (SN 92, -LR 0.29): R: Positive for low back discomfort at 70 degrees L:  Negative Crossed SLR (SP 90): R: Negative L: Negative  Facet Joint: Extension-Rotation (SN 100, -LR 0.0): R: Not examined L: Not examined  Lumbar Foraminal Stenosis: Lumbar quadrant (SN 70): R: Negative L: Negative  Hip: FABER (SN 81): R: Negative L: Negative FADIR (SN 94): R: Negative L: Negative Hip scour (SN 50): R: Positive for mild low back pain L: Negative  SIJ:  Thigh Thrust (SN 88, -LR 0.18) : R: Not examined L: Not examined  Piriformis Syndrome: FAIR Test (SN 88, SP 83): R: Not examined L: Not examined  Functional Tasks Deferred  Beighton scale: Deferred   TODAY'S TREATMENT:   SUBJECTIVE: Pt reports  that he is doing alright today. No significant changes since the last  therapy session. No improvement in symptoms after the last session and no relief from TENS.. No specific questions currently.   PAIN: 3/10 right low back pain;   Manual Therapy  Gentle hip FABER and FADIR stretches x 30s each bilateral; Supine hamstring strength to onset of low back symptoms followed by gentle ankle PF/DF for sciatic nerve tensioning x multiple bouts bilaterally; Belt assisted long axis hip distraction 2 x 30s bilaterally; Belt assisted hip medial to lateral mobilizations at 45 degrees flexion, grade II-III, 30s/bout x 2 bouts BLE; Belt assisted hip inferior mobilizations at 90 flexion, grade II-III, 30s/bout x 2 bouts BLE;  . Ther-ex  Hooklying posterior pelvic tilts 3s hold x 10; Hooklying lumbar rotation rocking 2 x 30s; Hooklying posterior tilt with alternating marches x 10 BLE; Supine SLR x 10 BLE; Hooklying clams with green tband x 10; Hooklying adductor squeeze with manual resistance x 10; Updated and reviewed HEP;   PATIENT EDUCATION:  Education details: Plan of care and HEP Person educated: Patient Education method: Explanation, handout; Education comprehension: verbalized understanding   HOME EXERCISE PROGRAM:  Access Code: [email or web address] URL: [email or web address] Date: 04/02/2022 Prepared by: Ria Comment  Exercises - Hooklying Single Knee to Chest Stretch  - 2 x daily - 7 x weekly - 3 reps - 30s hold - Supine Lower Trunk Rotation  - 2 x daily - 7 x weekly - 2 sets - 10 reps - 3s hold - Supine Active Straight Leg Raise  - 2 x daily - 7 x weekly - 2 sets - 10 reps - 3s hold - Supine Posterior Pelvic Tilt  - 2 x daily - 7 x weekly - 2 sets - 10 reps - 3s hold   ASSESSMENT:  CLINICAL IMPRESSION: Patient demonstrates excellent motivation during session today. Continued manual techniques with patient during session today including lumbar distraction through hip mobilizations as well as soft tissue mobilization. He reports relief of pain  with long axis hip distraction. Updated HEP and pt encouraged to follow-up as scheduled. Pt will benefit from PT services to address deficits in pain and strength in order to return to full function at home.   OBJECTIVE IMPAIRMENTS: decreased ROM and pain.   ACTIVITY LIMITATIONS: lifting, bending, sitting, and standing  PARTICIPATION LIMITATIONS: cleaning, community activity, occupation, and yard work  PERSONAL FACTORS: Age, Past/current experiences, Time since onset of injury/illness/exacerbation, and 1 comorbidity: OSA, and chronic pain  are also affecting patient's functional outcome.   REHAB POTENTIAL: Fair    CLINICAL DECISION MAKING: Stable/uncomplicated  EVALUATION COMPLEXITY: Low   GOALS: Goals reviewed with patient? No  SHORT TERM GOALS: Target date: 04/23/2022  Pt will be independent with HEP in order to improve strength and decrease back pain to improve pain-free function at home and work. Baseline:  Goal status: INITIAL   LONG TERM GOALS: Target date: 05/21/2022  Pt will increase FOTO to at least 53 to demonstrate significant improvement in function at home and work related to back pain  Baseline: 03/26/22: 40 Goal status: INITIAL  2.  Pt will decrease worst back pain by at least 2 points on the NPRS in order to demonstrate clinically significant reduction in back pain. Baseline: 03/26/22: 10/10 Goal status: INITIAL  3.  Pt will decrease mODI score by at least 13 points in order demonstrate clinically significant reduction in back pain/disability.       Baseline: 03/26/22: To be completed Goal status: INITIAL  4.  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) and work (extended sitting) with less pain Baseline:  Goal status: INITIAL   PLAN: PT FREQUENCY: 1-2x/week  PT DURATION: 8 weeks  PLANNED INTERVENTIONS: Therapeutic exercises, Therapeutic activity, Neuromuscular  re-education, Balance training, Gait training, Patient/Family education, Self Care, Joint mobilization, Joint manipulation, Vestibular training, Canalith repositioning, Orthotic/Fit training, DME instructions, Dry Needling, Electrical stimulation, Spinal manipulation, Spinal mobilization, Cryotherapy, Moist heat, Taping, Traction, Ultrasound, Ionotophoresis /ml Dexamethasone, Manual therapy, and Re-evaluation.  PLAN FOR NEXT SESSION: Patient to complete modified ODI, continue manual techniques, consider TDN to lumbar paraspinals, review/modify HEP as appropriate;   Sharalyn Ink Kazoua Gossen PT, DPT, GCS  Stephen Baruch, PT 04/04/2022, 1:11 PM

## 2022-04-11 ENCOUNTER — Other Ambulatory Visit: Payer: Self-pay | Admitting: Neurosurgery

## 2022-04-11 ENCOUNTER — Other Ambulatory Visit: Payer: Self-pay | Admitting: Infectious Diseases

## 2022-04-11 ENCOUNTER — Ambulatory Visit
Admission: RE | Admit: 2022-04-11 | Discharge: 2022-04-11 | Disposition: A | Discharge status: Home / Self Care | Payer: BC Managed Care – PPO | Source: Ambulatory Visit | Attending: Infectious Diseases | Admitting: Infectious Diseases

## 2022-04-11 ENCOUNTER — Ambulatory Visit: Payer: BC Managed Care – PPO

## 2022-04-11 DIAGNOSIS — M7989 Other specified soft tissue disorders: Secondary | ICD-10-CM | POA: Diagnosis not present

## 2022-04-11 DIAGNOSIS — M79605 Pain in left leg: Secondary | ICD-10-CM | POA: Insufficient documentation

## 2022-04-18 NOTE — Pre-Procedure Instructions (Signed)
Surgical Instructions    Your procedure is scheduled on Friday. Janaury 19, 2024 at 8:00 AM.  Report to [facility] Main Entrance "A" at 6:00 A.M., then check in with the Admitting office.  Call this number if you have problems the morning of surgery:  [phone]   If you have any questions prior to your surgery date call [phone]: Open Monday-Friday 8am-4pm  *If you experience any cold or flu symptoms such as cough, fever, chills, shortness of breath, etc. between now and your scheduled surgery, please notify us.*    Remember:  Do not eat or drink after midnight the night before your surgery    Take these medicines the morning of surgery with A SIP OF WATER:  gabapentin (NEURONTIN)  omeprazole (PRILOSEC)  tiZANidine (ZANAFLEX)   IF NEEDED: acetaminophen-codeine (TYLENOL #3)  aspirin-acetaminophen-caffeine (EXCEDRIN MIGRAINE)  oxymetazoline (AFRIN)    As of today, STOP taking any Aspirin (unless otherwise instructed by your surgeon) Aleve, Naproxen, Ibuprofen, Motrin, Advil, meloxicam (MOBIC), Goody's, BC's, all herbal medications, fish oil, and all vitamins. This includes your diclofenac sodium (VOLTAREN) 1 % GEL .                     Do NOT Smoke (Tobacco/Vaping) for 24 hours prior to your procedure.  If you use a CPAP at night, you may bring your mask/headgear for your overnight stay.   Contacts, glasses, piercing's, hearing aid's, dentures or partials may not be worn into surgery, please bring cases for these belongings.    For patients admitted to the hospital, discharge time will be determined by your treatment team.   Patients discharged the day of surgery will not be allowed to drive home, and someone needs to stay with them for 24 hours.  SURGICAL WAITING ROOM VISITATION Patients having surgery or a procedure may have two support people in the waiting area. Visitors may stay in the waiting area during the procedure and switch out with other visitors if  needed. Only 1 support person is allowed in the pre-op area with the patient AFTER the patient is prepped. This person cannot be switched out. Children under the age of 39 must have an adult accompany them who is not the patient. If the patient needs to stay at the hospital during part of their recovery, the visitor guidelines for inpatient rooms apply.  Please refer to the [facility] website for the visitor guidelines for Inpatients (after your surgery is over and you are in a regular room).    Special instructions:   [place]- Preparing For Surgery  Before surgery, you can play an important role. Because skin is not sterile, your skin needs to be as free of germs as possible. You can reduce the number of germs on your skin by washing with CHG (chlorahexidine gluconate) Soap before surgery.  CHG is an antiseptic cleaner which kills germs and bonds with the skin to continue killing germs even after washing.    Oral Hygiene is also important to reduce your risk of infection.  Remember - BRUSH YOUR TEETH THE MORNING OF SURGERY WITH YOUR REGULAR TOOTHPASTE  Please do not use if you have an allergy to CHG or antibacterial soaps. If your skin becomes reddened/irritated stop using the CHG.  Do not shave (including legs and underarms) for at least 48 hours prior to first CHG shower. It is OK to shave your face.  Please follow these instructions carefully.   Shower the [employer] SURGERY and the  MORNING OF SURGERY  If you chose to wash your hair, wash your hair first as usual with your normal shampoo.  After you shampoo, rinse your hair and body thoroughly to remove the shampoo.  Use CHG Soap as you would any other liquid soap. You can apply CHG directly to the skin and wash gently with a scrungie or a clean washcloth.   Apply the CHG Soap to your body ONLY FROM THE NECK DOWN.  Do not use on open wounds or open sores. Avoid contact with your eyes, ears, mouth and genitals (private parts).  Wash Face and genitals (private parts)  with your normal soap.   Wash thoroughly, paying special attention to the area where your surgery will be performed.  Thoroughly rinse your body with warm water from the neck down.  DO NOT shower/wash with your normal soap after using and rinsing off the CHG Soap.  Pat yourself dry with a CLEAN TOWEL.  Wear CLEAN PAJAMAS to bed the night before surgery  Place CLEAN SHEETS on your bed the night before your surgery  DO NOT SLEEP WITH PETS.   Day of Surgery: Take a shower with CHG soap. Do not wear jewelry. Do not wear lotions, powders, perfumes/colognes, or deodorant. Do not shave 48 hours prior to surgery.  Men may shave face and neck. Do not wear nail polish, gel polish, artificial nails, or any other type of covering on natural nails (fingers and toes) If you have artificial nails or gel coating that need to be removed by a nail salon, please have this removed prior to surgery. Artificial nails or gel coating may interfere with anesthesia's ability to adequately monitor your vital signs. Wear Clean/Comfortable clothing the morning of surgery Do not bring valuables to the hospital.  [facility] is not responsible for any belongings or valuables. Remember to brush your teeth WITH YOUR REGULAR TOOTHPASTE.   Please read over the following fact sheets that you were given.  If you received a COVID test during your pre-op visit  it is requested that you wear a mask when out in public, stay away from anyone that may not be feeling well and notify your surgeon if you develop symptoms. If you have been in contact with anyone that has tested positive in the last 10 days please notify you surgeon.

## 2022-04-19 ENCOUNTER — Encounter (HOSPITAL_COMMUNITY): Payer: Self-pay

## 2022-04-19 ENCOUNTER — Other Ambulatory Visit: Payer: Self-pay

## 2022-04-19 ENCOUNTER — Encounter (HOSPITAL_COMMUNITY)
Admission: RE | Admit: 2022-04-19 | Discharge: 2022-04-19 | Disposition: A | Discharge status: Home / Self Care | Payer: BC Managed Care – PPO | Source: Ambulatory Visit | Attending: Neurosurgery | Admitting: Neurosurgery

## 2022-04-19 DIAGNOSIS — Z01818 Encounter for other preprocedural examination: Secondary | ICD-10-CM | POA: Diagnosis present

## 2022-04-19 HISTORY — DX: Gastro-esophageal reflux disease without esophagitis: K21.9

## 2022-04-19 HISTORY — DX: Headache, unspecified: R51.9

## 2022-04-19 LAB — SURGICAL PCR SCREEN
MRSA, PCR: NEGATIVE
Staphylococcus aureus: POSITIVE — AB

## 2022-04-19 LAB — CBC
HCT: 44.2 % (ref 39.0–52.0)
Hemoglobin: 14.2 g/dL (ref 13.0–17.0)
MCH: 27.6 pg (ref 26.0–34.0)
MCHC: 32.1 g/dL (ref 30.0–36.0)
MCV: 85.8 fL (ref 80.0–100.0)
Platelets: 312 10*3/uL (ref 150–400)
RBC: 5.15 MIL/uL (ref 4.22–5.81)
RDW: 13.5 % (ref 11.5–15.5)
WBC: 5.8 10*3/uL (ref 4.0–10.5)
nRBC: 0 % (ref 0.0–0.2)

## 2022-04-19 LAB — BASIC METABOLIC PANEL
Anion gap: 7 (ref 5–15)
BUN: 16 mg/dL (ref 6–20)
CO2: 25 mmol/L (ref 22–32)
Calcium: 8.8 mg/dL — ABNORMAL LOW (ref 8.9–10.3)
Chloride: 104 mmol/L (ref 98–111)
Creatinine, Ser: 0.89 mg/dL (ref 0.61–1.24)
GFR, Estimated: >60 mL/min (ref >60)
Glucose, Bld: 115 mg/dL — ABNORMAL HIGH (ref 70–99)
Potassium: 3.9 mmol/L (ref 3.5–5.1)
Sodium: 136 mmol/L (ref 135–145)

## 2022-04-19 LAB — TYPE AND SCREEN
ABO/RH(D): O POS
Antibody Screen: NEGATIVE

## 2022-04-19 NOTE — Progress Notes (Signed)
PCP - Adrian Prows ,MD Cardiologist - denies  PPM/ICD - denies Device Orders -  Rep Notified -   Chest x-ray - na EKG - 04/19/22 Stress Test - 2016-2017 at [facility] - denies Cardiac Cath - denies  Sleep Study -08/24/21  CPAP - yes  Fasting Blood Sugar - na Checks Blood Sugar _____ times a day  Last dose of GLP1 agonist-  no GLP1 instructions:   Blood Thinner Instructions:na Aspirin Instructions:na  ERAS Protcol -no PRE-SURGERY Ensure or G2-   COVID TEST- na   Anesthesia review: no  Patient denies shortness of breath, fever, cough and chest pain at PAT appointment   All instructions explained to the patient, with a verbal understanding of the material. Patient agrees to go over the instructions while at home for a better understanding. Patient also instructed to self quarantine after being tested for COVID-19. The opportunity to ask questions was provided.

## 2022-04-19 NOTE — Pre-Procedure Instructions (Signed)
Surgical Instructions    Your procedure is scheduled on Friday. Janaury 19, 2024 at 8:00 AM.  Report to [facility] Main Entrance "A" at 6:00 A.M., then check in with the Admitting office.  Call this number if you have problems the morning of surgery:  [phone]   If you have any questions prior to your surgery date call [phone]: Open Monday-Friday 8am-4pm  *If you experience any cold or flu symptoms such as cough, fever, chills, shortness of breath, etc. between now and your scheduled surgery, please notify us.*    Remember:  Do not eat or drink after midnight the night before your surgery    Take these medicines the morning of surgery with A SIP OF WATER:  gabapentin (NEURONTIN)  omeprazole (PRILOSEC)   IF NEEDED: acetaminophen-codeine (TYLENOL #3)  tiZANidine (ZANAFLEX)  oxymetazoline (AFRIN)  traMADol (ULTRAM)   As of today, STOP taking any Aspirin (unless otherwise instructed by your surgeon) Aleve, Naproxen, Ibuprofen, Motrin, Advil, meloxicam (MOBIC), Goody's, BC's, all herbal medications, fish oil, and all vitamins. This includes your diclofenac sodium (VOLTAREN) 1 % GEL .                     Do NOT Smoke (Tobacco/Vaping) for 24 hours prior to your procedure.  If you use a CPAP at night, you may bring your mask/headgear for your overnight stay.   Contacts, glasses, piercing's, hearing aid's, dentures or partials may not be worn into surgery, please bring cases for these belongings.    For patients admitted to the hospital, discharge time will be determined by your treatment team.   Patients discharged the day of surgery will not be allowed to drive home, and someone needs to stay with them for 24 hours.  SURGICAL WAITING ROOM VISITATION Patients having surgery or a procedure may have two support people in the waiting area. Visitors may stay in the waiting area during the procedure and switch out with other visitors if needed. Only 1 support person is allowed  in the pre-op area with the patient AFTER the patient is prepped. This person cannot be switched out. Children under the age of 23 must have an adult accompany them who is not the patient. If the patient needs to stay at the hospital during part of their recovery, the visitor guidelines for inpatient rooms apply.  Please refer to the [facility] website for the visitor guidelines for Inpatients (after your surgery is over and you are in a regular room).    Special instructions:   [facility]- Preparing For Surgery  Before surgery, you can play an important role. Because skin is not sterile, your skin needs to be as free of germs as possible. You can reduce the number of germs on your skin by washing with CHG (chlorahexidine gluconate) Soap before surgery.  CHG is an antiseptic cleaner which kills germs and bonds with the skin to continue killing germs even after washing.    Oral Hygiene is also important to reduce your risk of infection.  Remember - BRUSH YOUR TEETH THE MORNING OF SURGERY WITH YOUR REGULAR TOOTHPASTE  Please do not use if you have an allergy to CHG or antibacterial soaps. If your skin becomes reddened/irritated stop using the CHG.  Do not shave (including legs and underarms) for at least 48 hours prior to first CHG shower. It is OK to shave your face.  Please follow these instructions carefully.   Shower the NIGHT BEFORE SURGERY and the MORNING OF  SURGERY  If you chose to wash your hair, wash your hair first as usual with your normal shampoo.  After you shampoo, rinse your hair and body thoroughly to remove the shampoo.  Use CHG Soap as you would any other liquid soap. You can apply CHG directly to the skin and wash gently with a scrungie or a clean washcloth.   Apply the CHG Soap to your body ONLY FROM THE NECK DOWN.  Do not use on open wounds or open sores. Avoid contact with your eyes, ears, mouth and genitals (private parts). Wash Face and genitals (private parts)  with  your normal soap.   Wash thoroughly, paying special attention to the area where your surgery will be performed.  Thoroughly rinse your body with warm water from the neck down.  DO NOT shower/wash with your normal soap after using and rinsing off the CHG Soap.  Pat yourself dry with a CLEAN TOWEL.  Wear CLEAN PAJAMAS to bed the night before surgery  Place CLEAN SHEETS on your bed the night before your surgery  DO NOT SLEEP WITH PETS.   Day of Surgery: Take a shower with CHG soap. Do not wear jewelry. Do not wear lotions, powders, perfumes/colognes, or deodorant. Do not shave 48 hours prior to surgery.  Men may shave face and neck. Do not wear nail polish, gel polish, artificial nails, or any other type of covering on natural nails (fingers and toes) If you have artificial nails or gel coating that need to be removed by a nail salon, please have this removed prior to surgery. Artificial nails or gel coating may interfere with anesthesia's ability to adequately monitor your vital signs. Wear Clean/Comfortable clothing the morning of surgery Do not bring valuables to the hospital.  [facility] is not responsible for any belongings or valuables. Remember to brush your teeth WITH YOUR REGULAR TOOTHPASTE.   Please read over the following fact sheets that you were given.  If you received a COVID test during your pre-op visit  it is requested that you wear a mask when out in public, stay away from anyone that may not be feeling well and notify your surgeon if you develop symptoms. If you have been in contact with anyone that has tested positive in the last 10 days please notify you surgeon.

## 2022-04-26 ENCOUNTER — Telehealth: Payer: Self-pay

## 2022-04-26 NOTE — Telephone Encounter (Signed)
Patient's wife, Robbi Scurlock, called the clinic and also walked in to speak with this therapist. She is requesting a note be sent to neurosurgery indicating therapist's opinion regarding patient's prognosis for improvement with physical therapy management. Per wife, insurance is refusing to approve surgery. Patient was seen by this therapist for an evaluation and two follow-up treatment sessions during which time he denied any improvement in pain or function. Given a lack of symptom/functional improvement with physical therapy it is the opinion of this therapist that patient is unlikely to improve with additional therapy visits.   Phillips Grout PT, DPT, GCS  [facility] PT License #: [id number] 63/87/56 2:20 PM

## 2022-05-02 NOTE — Anesthesia Preprocedure Evaluation (Addendum)
Anesthesia Evaluation  Patient identified by MRN, date of birth, ID band Patient awake    Reviewed: Allergy & Precautions, NPO status , Patient's Chart, lab work & pertinent test results  Airway Mallampati: II  TM Distance: >3 FB Neck ROM: Full    Dental no notable dental hx.    Pulmonary sleep apnea    Pulmonary exam normal        Cardiovascular hypertension, Pt. on medications  Rhythm:Regular Rate:Normal     Neuro/Psych  Headaches  negative psych ROS   GI/Hepatic Neg liver ROS,GERD  Medicated,,  Endo/Other  negative endocrine ROS    Renal/GU negative Renal ROS  negative genitourinary   Musculoskeletal  (+) Arthritis , Osteoarthritis,    Abdominal Normal abdominal exam  (+)   Peds  Hematology negative hematology ROS (+)   Anesthesia Other Findings   Reproductive/Obstetrics                             Anesthesia Physical Anesthesia Plan  ASA: 2  Anesthesia Plan: General   Post-op Pain Management: Celebrex PO (pre-op)*, Tylenol PO (pre-op)* and Ketamine IV*   Induction: Intravenous  PONV Risk Score and Plan: 2 and Ondansetron, Dexamethasone, Midazolam and Treatment may vary due to age or medical condition  Airway Management Planned: Mask and Oral ETT  Additional Equipment: None  Intra-op Plan:   Post-operative Plan: Extubation in OR  Informed Consent: I have reviewed the patients History and Physical, chart, labs and discussed the procedure including the risks, benefits and alternatives for the proposed anesthesia with the patient or authorized representative who has indicated his/her understanding and acceptance.     Dental advisory given  Plan Discussed with: CRNA  Anesthesia Plan Comments: (Lab Results      Component                Value               Date                      WBC                      5.8                 04/19/2022                HGB                       14.2                04/19/2022                HCT                      44.2                04/19/2022                MCV                      85.8                04/19/2022                PLT  312                 04/19/2022             Lab Results      Component                Value               Date                      NA                       136                 04/19/2022                K                        3.9                 04/19/2022                CO2                      25                  04/19/2022                GLUCOSE                  115 (H)             04/19/2022                BUN                      16                  04/19/2022                CREATININE               0.89                04/19/2022                CALCIUM                  8.8 (L)             04/19/2022                GFRNONAA                 >60                 04/19/2022           )       Anesthesia Quick Evaluation

## 2022-05-03 ENCOUNTER — Observation Stay (HOSPITAL_COMMUNITY)
Admission: RE | Admit: 2022-05-03 | Discharge: 2022-05-04 | Disposition: A | Discharge status: Home / Self Care | Payer: BC Managed Care – PPO | Attending: Neurosurgery | Admitting: Neurosurgery

## 2022-05-03 ENCOUNTER — Other Ambulatory Visit: Payer: Self-pay

## 2022-05-03 ENCOUNTER — Ambulatory Visit (HOSPITAL_COMMUNITY): Payer: BC Managed Care – PPO

## 2022-05-03 ENCOUNTER — Ambulatory Visit (HOSPITAL_COMMUNITY): Payer: BC Managed Care – PPO | Admitting: Anesthesiology

## 2022-05-03 ENCOUNTER — Encounter (HOSPITAL_COMMUNITY): Payer: Self-pay | Admitting: Neurosurgery

## 2022-05-03 ENCOUNTER — Encounter (HOSPITAL_COMMUNITY)
Admission: RE | Disposition: A | Discharge status: Home / Self Care | Payer: Self-pay | Source: Home / Self Care | Attending: Neurosurgery

## 2022-05-03 DIAGNOSIS — M5136 Other intervertebral disc degeneration, lumbar region: Secondary | ICD-10-CM | POA: Diagnosis not present

## 2022-05-03 DIAGNOSIS — Z7982 Long term (current) use of aspirin: Secondary | ICD-10-CM | POA: Insufficient documentation

## 2022-05-03 DIAGNOSIS — M5116 Intervertebral disc disorders with radiculopathy, lumbar region: Secondary | ICD-10-CM | POA: Diagnosis not present

## 2022-05-03 DIAGNOSIS — M48061 Spinal stenosis, lumbar region without neurogenic claudication: Secondary | ICD-10-CM | POA: Diagnosis present

## 2022-05-03 DIAGNOSIS — Z79899 Other long term (current) drug therapy: Secondary | ICD-10-CM | POA: Insufficient documentation

## 2022-05-03 DIAGNOSIS — I10 Essential (primary) hypertension: Secondary | ICD-10-CM | POA: Insufficient documentation

## 2022-05-03 HISTORY — PX: ANTERIOR LATERAL LUMBAR FUSION WITH PERCUTANEOUS SCREW 1 LEVEL: SHX5553

## 2022-05-03 LAB — ABO/RH: ABO/RH(D): O POS

## 2022-05-03 SURGERY — ANTERIOR LATERAL LUMBAR FUSION WITH PERCUTANEOUS SCREW 1 LEVEL
Anesthesia: General | Laterality: Left

## 2022-05-03 MED ORDER — MIDAZOLAM HCL 2 MG/2ML IJ SOLN
INTRAMUSCULAR | Status: AC
  Filled 2022-05-03: qty 2

## 2022-05-03 MED ORDER — CHLORHEXIDINE GLUCONATE 0.12 % MT SOLN
15.0000 mL | Freq: Once | OROMUCOSAL | Status: AC
  Administered 2022-05-03: 15 mL via OROMUCOSAL
  Filled 2022-05-03: qty 15

## 2022-05-03 MED ORDER — FENTANYL CITRATE (PF) 250 MCG/5ML IJ SOLN
INTRAMUSCULAR | Status: AC
  Filled 2022-05-03: qty 5

## 2022-05-03 MED ORDER — ONDANSETRON HCL 4 MG/2ML IJ SOLN
INTRAMUSCULAR | Status: AC
  Filled 2022-05-03: qty 2

## 2022-05-03 MED ORDER — POLYETHYLENE GLYCOL 3350 17 G PO PACK: 17.0000 g | PACK | Freq: Every day | ORAL | Status: DC | PRN

## 2022-05-03 MED ORDER — ACETAMINOPHEN 325 MG PO TABS
650.0000 mg | ORAL_TABLET | ORAL | Status: DC | PRN
  Administered 2022-05-03 – 2022-05-04 (×3): 650 mg via ORAL
  Filled 2022-05-03 (×3): qty 2

## 2022-05-03 MED ORDER — TAMSULOSIN HCL 0.4 MG PO CAPS
0.4000 mg | ORAL_CAPSULE | Freq: Every day | ORAL | Status: DC
  Administered 2022-05-03: 0.4 mg via ORAL
  Filled 2022-05-03: qty 1

## 2022-05-03 MED ORDER — SODIUM CHLORIDE 0.9% FLUSH
3.0000 mL | Freq: Two times a day (BID) | INTRAVENOUS | Status: DC
  Administered 2022-05-03 (×2): 3 mL via INTRAVENOUS

## 2022-05-03 MED ORDER — MENTHOL 3 MG MT LOZG: 1.0000 | LOZENGE | OROMUCOSAL | Status: DC | PRN

## 2022-05-03 MED ORDER — PHENYLEPHRINE 80 MCG/ML (10ML) SYRINGE FOR IV PUSH (FOR BLOOD PRESSURE SUPPORT)
PREFILLED_SYRINGE | INTRAVENOUS | Status: AC
  Filled 2022-05-03: qty 10

## 2022-05-03 MED ORDER — OXYCODONE HCL 5 MG PO TABS
10.0000 mg | ORAL_TABLET | ORAL | Status: DC | PRN
  Administered 2022-05-03 – 2022-05-04 (×8): 10 mg via ORAL
  Filled 2022-05-03 (×8): qty 2

## 2022-05-03 MED ORDER — GLYCOPYRROLATE PF 0.2 MG/ML IJ SOSY
PREFILLED_SYRINGE | INTRAMUSCULAR | Status: AC
  Filled 2022-05-03: qty 1

## 2022-05-03 MED ORDER — SIMVASTATIN 20 MG PO TABS
40.0000 mg | ORAL_TABLET | Freq: Every day | ORAL | Status: DC
  Administered 2022-05-03: 40 mg via ORAL
  Filled 2022-05-03: qty 2

## 2022-05-03 MED ORDER — GLYCOPYRROLATE PF 0.2 MG/ML IJ SOSY
PREFILLED_SYRINGE | INTRAMUSCULAR | Status: DC | PRN
  Administered 2022-05-03: .1 mg via INTRAVENOUS

## 2022-05-03 MED ORDER — ONDANSETRON HCL 4 MG/2ML IJ SOLN
INTRAMUSCULAR | Status: DC | PRN
  Administered 2022-05-03: 4 mg via INTRAVENOUS

## 2022-05-03 MED ORDER — PROPOFOL 10 MG/ML IV BOLUS
INTRAVENOUS | Status: AC
  Filled 2022-05-03: qty 20

## 2022-05-03 MED ORDER — PHENYLEPHRINE HCL-NACL 20-0.9 MG/250ML-% IV SOLN
INTRAVENOUS | Status: DC | PRN
  Administered 2022-05-03: 50 ug/min via INTRAVENOUS

## 2022-05-03 MED ORDER — PHENOL 1.4 % MT LIQD: 1.0000 | OROMUCOSAL | Status: DC | PRN

## 2022-05-03 MED ORDER — IRBESARTAN 300 MG PO TABS
300.0000 mg | ORAL_TABLET | Freq: Every day | ORAL | Status: DC
  Administered 2022-05-03: 300 mg via ORAL
  Filled 2022-05-03 (×2): qty 1

## 2022-05-03 MED ORDER — HYDROMORPHONE HCL 1 MG/ML IJ SOLN: 1.0000 mg | INTRAMUSCULAR | Status: DC | PRN

## 2022-05-03 MED ORDER — FENTANYL CITRATE (PF) 250 MCG/5ML IJ SOLN
INTRAMUSCULAR | Status: DC | PRN
  Administered 2022-05-03 (×3): 50 ug via INTRAVENOUS
  Administered 2022-05-03: 100 ug via INTRAVENOUS

## 2022-05-03 MED ORDER — BISACODYL 10 MG RE SUPP: 10.0000 mg | Freq: Every day | RECTAL | Status: DC | PRN

## 2022-05-03 MED ORDER — LORATADINE 10 MG PO TABS: 10.0000 mg | ORAL_TABLET | Freq: Every day | ORAL | Status: DC

## 2022-05-03 MED ORDER — HYDROCODONE-ACETAMINOPHEN 10-325 MG PO TABS: 1.0000 | ORAL_TABLET | ORAL | Status: DC | PRN

## 2022-05-03 MED ORDER — HYDROCHLOROTHIAZIDE 25 MG PO TABS
25.0000 mg | ORAL_TABLET | Freq: Every day | ORAL | Status: DC
  Administered 2022-05-03 – 2022-05-04 (×2): 25 mg via ORAL
  Filled 2022-05-03 (×2): qty 1

## 2022-05-03 MED ORDER — LIDOCAINE 2% (20 MG/ML) 5 ML SYRINGE
INTRAMUSCULAR | Status: DC | PRN
  Administered 2022-05-03: 40 mg via INTRAVENOUS

## 2022-05-03 MED ORDER — METHOCARBAMOL 1000 MG/10ML IJ SOLN: 500.0000 mg | Freq: Four times a day (QID) | INTRAVENOUS | Status: DC | PRN

## 2022-05-03 MED ORDER — CELECOXIB 200 MG PO CAPS
200.0000 mg | ORAL_CAPSULE | Freq: Once | ORAL | Status: AC
  Administered 2022-05-03: 200 mg via ORAL
  Filled 2022-05-03: qty 1

## 2022-05-03 MED ORDER — DEXAMETHASONE SODIUM PHOSPHATE 10 MG/ML IJ SOLN
INTRAMUSCULAR | Status: DC | PRN
  Administered 2022-05-03: 10 mg via INTRAVENOUS

## 2022-05-03 MED ORDER — MIDAZOLAM HCL 2 MG/2ML IJ SOLN
INTRAMUSCULAR | Status: DC | PRN
  Administered 2022-05-03: 1 mg via INTRAVENOUS

## 2022-05-03 MED ORDER — PANTOPRAZOLE SODIUM 40 MG PO TBEC
40.0000 mg | DELAYED_RELEASE_TABLET | Freq: Every day | ORAL | Status: DC
  Administered 2022-05-03 – 2022-05-04 (×2): 40 mg via ORAL
  Filled 2022-05-03 (×2): qty 1

## 2022-05-03 MED ORDER — CHLORHEXIDINE GLUCONATE CLOTH 2 % EX PADS: 6.0000 | MEDICATED_PAD | Freq: Once | CUTANEOUS | Status: DC

## 2022-05-03 MED ORDER — SUGAMMADEX SODIUM 200 MG/2ML IV SOLN
INTRAVENOUS | Status: DC | PRN
  Administered 2022-05-03: 200 mg via INTRAVENOUS

## 2022-05-03 MED ORDER — BUPIVACAINE HCL (PF) 0.25 % IJ SOLN
INTRAMUSCULAR | Status: AC
  Filled 2022-05-03: qty 30

## 2022-05-03 MED ORDER — KETAMINE HCL 50 MG/5ML IJ SOSY
PREFILLED_SYRINGE | INTRAMUSCULAR | Status: AC
  Filled 2022-05-03: qty 5

## 2022-05-03 MED ORDER — CEFAZOLIN SODIUM-DEXTROSE 1-4 GM/50ML-% IV SOLN
1.0000 g | Freq: Three times a day (TID) | INTRAVENOUS | Status: AC
  Administered 2022-05-03 (×2): 1 g via INTRAVENOUS
  Filled 2022-05-03 (×2): qty 50

## 2022-05-03 MED ORDER — DUTASTERIDE 0.5 MG PO CAPS
0.5000 mg | ORAL_CAPSULE | Freq: Every day | ORAL | Status: DC
  Administered 2022-05-03 – 2022-05-04 (×2): 0.5 mg via ORAL
  Filled 2022-05-03 (×4): qty 1

## 2022-05-03 MED ORDER — 0.9 % SODIUM CHLORIDE (POUR BTL) OPTIME
TOPICAL | Status: DC | PRN
  Administered 2022-05-03 (×2): 1000 mL

## 2022-05-03 MED ORDER — SODIUM CHLORIDE 0.9% FLUSH: 3.0000 mL | INTRAVENOUS | Status: DC | PRN

## 2022-05-03 MED ORDER — PHENYLEPHRINE 80 MCG/ML (10ML) SYRINGE FOR IV PUSH (FOR BLOOD PRESSURE SUPPORT)
PREFILLED_SYRINGE | INTRAVENOUS | Status: DC | PRN
  Administered 2022-05-03: 240 ug via INTRAVENOUS
  Administered 2022-05-03: 80 ug via INTRAVENOUS

## 2022-05-03 MED ORDER — DEXAMETHASONE SODIUM PHOSPHATE 10 MG/ML IJ SOLN
INTRAMUSCULAR | Status: AC
  Filled 2022-05-03: qty 1

## 2022-05-03 MED ORDER — ONDANSETRON HCL 4 MG PO TABS: 4.0000 mg | ORAL_TABLET | Freq: Four times a day (QID) | ORAL | Status: DC | PRN

## 2022-05-03 MED ORDER — ADULT MULTIVITAMIN W/MINERALS CH
1.0000 | ORAL_TABLET | Freq: Every day | ORAL | Status: DC
  Administered 2022-05-03: 1 via ORAL
  Filled 2022-05-03: qty 1

## 2022-05-03 MED ORDER — ROCURONIUM BROMIDE 10 MG/ML (PF) SYRINGE
PREFILLED_SYRINGE | INTRAVENOUS | Status: AC
  Filled 2022-05-03: qty 10

## 2022-05-03 MED ORDER — LACTATED RINGERS IV SOLN: INTRAVENOUS | Status: DC

## 2022-05-03 MED ORDER — ORAL CARE MOUTH RINSE: 15.0000 mL | Freq: Once | OROMUCOSAL | Status: AC

## 2022-05-03 MED ORDER — ROCURONIUM BROMIDE 10 MG/ML (PF) SYRINGE
PREFILLED_SYRINGE | INTRAVENOUS | Status: DC | PRN
  Administered 2022-05-03: 60 mg via INTRAVENOUS

## 2022-05-03 MED ORDER — METHOCARBAMOL 500 MG PO TABS
500.0000 mg | ORAL_TABLET | Freq: Four times a day (QID) | ORAL | Status: DC | PRN
  Administered 2022-05-03 – 2022-05-04 (×4): 500 mg via ORAL
  Filled 2022-05-03 (×4): qty 1

## 2022-05-03 MED ORDER — SODIUM CHLORIDE 0.9 % IV SOLN
250.0000 mL | INTRAVENOUS | Status: DC
  Administered 2022-05-03: 250 mL via INTRAVENOUS

## 2022-05-03 MED ORDER — NORTRIPTYLINE HCL 25 MG PO CAPS
50.0000 mg | ORAL_CAPSULE | Freq: Every day | ORAL | Status: DC
  Administered 2022-05-03: 50 mg via ORAL
  Filled 2022-05-03 (×2): qty 2

## 2022-05-03 MED ORDER — MODAFINIL 100 MG PO TABS
100.0000 mg | ORAL_TABLET | Freq: Two times a day (BID) | ORAL | Status: DC
  Administered 2022-05-04: 100 mg via ORAL
  Filled 2022-05-03 (×4): qty 1

## 2022-05-03 MED ORDER — ONDANSETRON HCL 4 MG/2ML IJ SOLN
4.0000 mg | Freq: Four times a day (QID) | INTRAMUSCULAR | Status: DC | PRN
  Administered 2022-05-03: 4 mg via INTRAVENOUS
  Filled 2022-05-03: qty 2

## 2022-05-03 MED ORDER — FENTANYL CITRATE (PF) 100 MCG/2ML IJ SOLN
INTRAMUSCULAR | Status: AC
  Filled 2022-05-03: qty 2

## 2022-05-03 MED ORDER — GABAPENTIN 300 MG PO CAPS
600.0000 mg | ORAL_CAPSULE | Freq: Two times a day (BID) | ORAL | Status: DC
  Administered 2022-05-03 – 2022-05-04 (×2): 600 mg via ORAL
  Filled 2022-05-03 (×2): qty 2

## 2022-05-03 MED ORDER — GABAPENTIN 300 MG PO CAPS
900.0000 mg | ORAL_CAPSULE | Freq: Every day | ORAL | Status: DC
  Administered 2022-05-03: 900 mg via ORAL
  Filled 2022-05-03: qty 3

## 2022-05-03 MED ORDER — FLEET ENEMA 7-19 GM/118ML RE ENEM: 1.0000 | ENEMA | Freq: Once | RECTAL | Status: DC | PRN

## 2022-05-03 MED ORDER — KETAMINE HCL 10 MG/ML IJ SOLN
INTRAMUSCULAR | Status: DC | PRN
  Administered 2022-05-03 (×2): 20 mg via INTRAVENOUS
  Administered 2022-05-03: 10 mg via INTRAVENOUS

## 2022-05-03 MED ORDER — PROPOFOL 10 MG/ML IV BOLUS
INTRAVENOUS | Status: DC | PRN
  Administered 2022-05-03: 150 mg via INTRAVENOUS
  Administered 2022-05-03: 50 mg via INTRAVENOUS
  Administered 2022-05-03: 80 mg via INTRAVENOUS

## 2022-05-03 MED ORDER — VASOPRESSIN 20 UNIT/ML IV SOLN
INTRAVENOUS | Status: DC | PRN
  Administered 2022-05-03: 1 [IU] via INTRAVENOUS
  Administered 2022-05-03 (×3): .5 [IU] via INTRAVENOUS

## 2022-05-03 MED ORDER — BUPIVACAINE HCL (PF) 0.25 % IJ SOLN
INTRAMUSCULAR | Status: DC | PRN
  Administered 2022-05-03: 10 mL

## 2022-05-03 MED ORDER — THROMBIN 5000 UNITS EX SOLR
CUTANEOUS | Status: AC
  Filled 2022-05-03: qty 5000

## 2022-05-03 MED ORDER — PROPOFOL 500 MG/50ML IV EMUL
INTRAVENOUS | Status: DC | PRN
  Administered 2022-05-03: 85 ug/kg/min via INTRAVENOUS

## 2022-05-03 MED ORDER — DOCUSATE SODIUM 100 MG PO CAPS
100.0000 mg | ORAL_CAPSULE | Freq: Two times a day (BID) | ORAL | Status: DC
  Administered 2022-05-03 – 2022-05-04 (×3): 100 mg via ORAL
  Filled 2022-05-03 (×3): qty 1

## 2022-05-03 MED ORDER — FENOFIBRATE 160 MG PO TABS
160.0000 mg | ORAL_TABLET | Freq: Every day | ORAL | Status: DC
  Administered 2022-05-03 – 2022-05-04 (×2): 160 mg via ORAL
  Filled 2022-05-03 (×2): qty 1

## 2022-05-03 MED ORDER — VASOPRESSIN 20 UNIT/ML IV SOLN
INTRAVENOUS | Status: AC
  Filled 2022-05-03: qty 1

## 2022-05-03 MED ORDER — CEFAZOLIN SODIUM-DEXTROSE 2-4 GM/100ML-% IV SOLN
2.0000 g | INTRAVENOUS | Status: AC
  Administered 2022-05-03: 2 g via INTRAVENOUS
  Filled 2022-05-03: qty 100

## 2022-05-03 MED ORDER — ACETAMINOPHEN 500 MG PO TABS
1000.0000 mg | ORAL_TABLET | Freq: Once | ORAL | Status: AC
  Administered 2022-05-03: 1000 mg via ORAL
  Filled 2022-05-03: qty 2

## 2022-05-03 MED ORDER — FENTANYL CITRATE (PF) 100 MCG/2ML IJ SOLN
25.0000 ug | INTRAMUSCULAR | Status: DC | PRN
  Administered 2022-05-03: 25 ug via INTRAVENOUS
  Administered 2022-05-03: 50 ug via INTRAVENOUS

## 2022-05-03 MED ORDER — ACETAMINOPHEN 650 MG RE SUPP: 650.0000 mg | RECTAL | Status: DC | PRN

## 2022-05-03 SURGICAL SUPPLY — 51 items
BAG COUNTER SPONGE SURGICOUNT (BAG) ×1 IMPLANT
BAG DECANTER FOR FLEXI CONT (MISCELLANEOUS) ×1 IMPLANT
BENZOIN TINCTURE PRP APPL 2/3 (GAUZE/BANDAGES/DRESSINGS) ×1 IMPLANT
BLADE CLIPPER SURG (BLADE) IMPLANT
BONE MATRIX OSTEOCEL PRO LRG (Bone Implant) IMPLANT
BUR MATCHSTICK NEURO 3.0 LAGG (BURR) IMPLANT
COVER BACK TABLE 60X90IN (DRAPES) ×1 IMPLANT
DERMABOND ADVANCED .7 DNX12 (GAUZE/BANDAGES/DRESSINGS) ×2 IMPLANT
DRAPE C-ARM 42X72 X-RAY (DRAPES) ×1 IMPLANT
DRAPE C-ARMOR (DRAPES) ×1 IMPLANT
DRAPE LAPAROTOMY 100X72X124 (DRAPES) ×1 IMPLANT
DRAPE SURG 17X23 STRL (DRAPES) ×2 IMPLANT
DRSG OPSITE POSTOP 3X4 (GAUZE/BANDAGES/DRESSINGS) IMPLANT
DRSG OPSITE POSTOP 4X6 (GAUZE/BANDAGES/DRESSINGS) IMPLANT
DURAPREP 26ML APPLICATOR (WOUND CARE) ×1 IMPLANT
ELECT REM PT RETURN 9FT ADLT (ELECTROSURGICAL) ×1
ELECTRODE REM PT RTRN 9FT ADLT (ELECTROSURGICAL) ×1 IMPLANT
GAUZE 4X4 16PLY ~~LOC~~+RFID DBL (SPONGE) IMPLANT
GLOVE BIO SURGEON STRL SZ 6.5 (GLOVE) ×1 IMPLANT
GLOVE BIOGEL PI IND STRL 6.5 (GLOVE) ×1 IMPLANT
GLOVE ECLIPSE 9.0 STRL (GLOVE) ×1 IMPLANT
GLOVE EXAM NITRILE XL STR (GLOVE) IMPLANT
GOWN STRL REUS W/ TWL LRG LVL3 (GOWN DISPOSABLE) IMPLANT
GOWN STRL REUS W/ TWL XL LVL3 (GOWN DISPOSABLE) ×2 IMPLANT
GOWN STRL REUS W/TWL 2XL LVL3 (GOWN DISPOSABLE) IMPLANT
GOWN STRL REUS W/TWL LRG LVL3 (GOWN DISPOSABLE)
GOWN STRL REUS W/TWL XL LVL3 (GOWN DISPOSABLE) ×2
GUIDEWIRE NITINOL BEVEL TIP (WIRE) IMPLANT
KIT BASIN OR (CUSTOM PROCEDURE TRAY) ×1 IMPLANT
KIT DILATOR XLIF 5 (KITS) IMPLANT
KIT SURGICAL ACCESS MAXCESS 4 (KITS) IMPLANT
KIT TURNOVER KIT B (KITS) ×1 IMPLANT
MODULE NVM5 NEXT GEN EMG (NEEDLE) IMPLANT
MODULUS XLW 12X22X50MM 10DEG (Spine Construct) IMPLANT
NDL I-PASS III (NEEDLE) IMPLANT
NEEDLE HYPO 22GX1.5 SAFETY (NEEDLE) ×1 IMPLANT
NEEDLE I-PASS III (NEEDLE) ×1 IMPLANT
NS IRRIG 1000ML POUR BTL (IV SOLUTION) ×1 IMPLANT
PACK LAMINECTOMY NEURO (CUSTOM PROCEDURE TRAY) ×1 IMPLANT
PENCIL BUTTON HOLSTER BLD 10FT (ELECTRODE) IMPLANT
ROD RELINE MAS LORD 5.5X50 (Rod) IMPLANT
SCREW LOCK RELINE 5.5 TULIP (Screw) IMPLANT
SCREW MAS RELINE 6.5X45 POLY (Screw) IMPLANT
SPONGE T-LAP 4X18 ~~LOC~~+RFID (SPONGE) IMPLANT
STRIP CLOSURE SKIN 1/2X4 (GAUZE/BANDAGES/DRESSINGS) ×1 IMPLANT
SUT VIC AB 2-0 CT1 18 (SUTURE) ×2 IMPLANT
SUT VIC AB 3-0 SH 8-18 (SUTURE) ×2 IMPLANT
TOWEL GREEN STERILE (TOWEL DISPOSABLE) ×1 IMPLANT
TOWEL GREEN STERILE FF (TOWEL DISPOSABLE) ×1 IMPLANT
TRAY FOLEY MTR SLVR 16FR STAT (SET/KITS/TRAYS/PACK) ×1 IMPLANT
WATER STERILE IRR 1000ML POUR (IV SOLUTION) ×1 IMPLANT

## 2022-05-03 NOTE — Op Note (Signed)
Date of procedure: 05/03/2022  Date of dictation: Same  Service: Neurosurgery  Preoperative diagnosis: L3-4 degenerative disc disease with foraminal stenosis and radiculopathy.  Postoperative diagnosis: Same  Procedure Name: Left L3-4 anterior lateral retroperitoneal interbody decompression and fusion utilizing interbody cage and morselized allograft  L3-4 left-sided percutaneous pedicle screw fixation  Surgeon:Lyna Laningham A.Nataleah Scioneaux, M.D.  Asst. Surgeon: Christella Noa, MD; Reinaldo Meeker, NP  Anesthesia: General  Indication: 62 year old male with progressive worsening back and bilateral lower extremity symptoms left now greater than right.  Workup demonstrates evidence of disc generation with degenerative retrolisthesis at L3-4 with accompanying foraminal stenosis.  Patient with a broad-based central disc bulge and significant lateral recess and central stenosis as well.  He has failed conservative management presents now for left-sided L3-4 lateral interbody fusion in hopes of improving his situation.  Operative note: After induction of anesthesia, patient positioned in the right lateral decubitus position and appropriately padded.  Localizing fluoroscopy was used.  The patient was secured to the bed and the bed was flexed in order to gain access to the L3-4 space.  Patient's left flank and lumbar region prepped and draped sterilely.  Incision was made overlying the L3-4 disc space and a secondary incision was made in the left posterior flank.  Starting through the posterior flank incision blunt dissection was then made into the retroperitoneal space.  The psoas muscle was identified.  The peritoneal sac and contents were found to be free of the approach.  A dilator was then passed through the lateral incision through the psoas muscle and docked into the L3-4 disc space.  Intraoperative neuromonitoring was used throughout to ensure safe passage.  Trajectory had to be adjusted to be free of the lumbar plexus and  eventually good positioning was obtained.  Guidewire was then passed into the disc space at L3-4.  The dilators were sequentially enlarged and stimulated throughout and found to be in good position.  Self-retaining retractor was placed and secured.  This was then dilated.  The disc base was directly examined and there was no evidence of any surrounding neural structures.  The shim was passed in the disc space at L3-4.  The guidewire was removed.  Once again the disc base was further inspected and found to be free of any traversing nerve roots.  Disc base was incised.  Discectomy then performed.  Endplates were gradually cleaned of all disc material.  Contralateral release was then performed using Cobb elevators.  The disc base was then sequentially dilated and a 12 mm lordotic implant was found to be most appropriate with good fit and good reduction of his deformity.  A 12 mm x 50 mm x 22 mm NuVasive titanium printed cage was then packed with Osteocel plus.  This was then packed into place and confirmed to be in good position in both the AP and lateral planes.  The inserter was removed.  The self-retaining retractor was removed.  No evidence of complication at this point.  The bed was returned to a neutral position.  Working through the lumbar region on the left side entry sites for pedicle screws were determined.  Incisions were made overlying the entry sites.  Using a Jamshidi needle introducer passage was then made into the left L3 and L4 pedicles under fluoroscopic guidance and with continuous neuromonitoring.  Guidewires were placed into the vertebral bodies.  6.5 mm x 45 mm NuVasive pedicle screws were then placed at L3 and L4.  These were confirmed to be in good position both  AP and lateral planes.  A 50 mm rod was then passed through the screw towers and secured in place with locking caps.  Locking caps were then given a final tightening with the construct under mild compression.  The towers were removed.   Final images revealed good position of the construct with normal alignment of the spine and no evidence of complicating features.  There is no evidence of any retained sponges or other nonessential material.  Wounds were then irrigated.  They were closed in layers with Vicryl sutures.  Marcaine was infiltrated for anesthesia.  Steri-Strips and sterile dressing were applied.  No apparent complications.  Patient tolerated the procedure well and he returns to the recovery room postop.

## 2022-05-03 NOTE — Anesthesia Postprocedure Evaluation (Signed)
Anesthesia Post Note  Patient: Jonathan Lester  Procedure(s) Performed: EXTREME LATERAL INTERBODY FUSION LUMBAR THREE-LUMBAR FOUR LEFT POSTERIOR LATERAL AND INTERBODY FUSION (Left)     Patient location during evaluation: PACU Anesthesia Type: General Level of consciousness: awake and alert Pain management: pain level controlled Vital Signs Assessment: post-procedure vital signs reviewed and stable Respiratory status: spontaneous breathing, nonlabored ventilation, respiratory function stable and patient connected to nasal cannula oxygen Cardiovascular status: blood pressure returned to baseline and stable Postop Assessment: no apparent nausea or vomiting Anesthetic complications: no   No notable events documented.  Last Vitals:  Vitals:   05/03/22 1100 05/03/22 1116  BP: 117/68 121/67  Pulse: 80 85  Resp: 10 20  Temp: 36.5 C 36.6 C  SpO2: 95% 91%    Last Pain:  Vitals:   05/03/22 1116  TempSrc: Oral  PainSc:                  [name]

## 2022-05-03 NOTE — Brief Op Note (Signed)
05/03/2022  9:58 AM  PATIENT:  Jonathan Lester  62 y.o. male  PRE-OPERATIVE DIAGNOSIS:  Radiculopathy  POST-OPERATIVE DIAGNOSIS:  Radiculopathy  PROCEDURE:  Procedure(s): EXTREME LATERAL INTERBODY FUSION LUMBAR THREE-LUMBAR FOUR LEFT POSTERIOR LATERAL AND INTERBODY FUSION (Left)  SURGEON:  Surgeon(s) and Role:    * Earnie Larsson, MD - Primary    * Ashok Pall, MD - Assisting  PHYSICIAN ASSISTANT:   ASSISTANTSMearl Latin   ANESTHESIA:   general  EBL:  50 mL   BLOOD ADMINISTERED:none  DRAINS: none   LOCAL MEDICATIONS USED:  MARCAINE     SPECIMEN:  No Specimen  DISPOSITION OF SPECIMEN:  N/A  COUNTS:  YES  TOURNIQUET:  * No tourniquets in log *  DICTATION: .Dragon Dictation  PLAN OF CARE: Admit for overnight observation  PATIENT DISPOSITION:  PACU - hemodynamically stable.   Delay start of Pharmacological VTE agent (>24hrs) due to surgical blood loss or risk of bleeding: yes

## 2022-05-03 NOTE — H&P (Signed)
Jonathan Lester is an 62 y.o. male.   Chief Complaint: Back pain HPI: 62 year old male with chronic and progressive worsening lower back pain with radiation to both lower extremities left greater than right.  Workup demonstrates evidence of progressive degeneration with degenerative disc space collapse and some early retrolisthesis with foraminal stenosis at L3-4.  Patient also with a broad-based disc bulge causing significant central and lateral recess stenosis at L3-4.  Patient has failed conservative management presents now for a left-sided L3-4 anterior lateral interbody fusion in hopes of improving his symptoms.  Past Medical History:  Diagnosis Date   GERD (gastroesophageal reflux disease)    Headache    History of kidney stones    Hyperlipidemia    Hypertension    Sleep apnea     Past Surgical History:  Procedure Laterality Date   CHOLECYSTECTOMY     EXTRACORPOREAL SHOCK WAVE LITHOTRIPSY Right 01/18/2021   Procedure: EXTRACORPOREAL SHOCK WAVE LITHOTRIPSY (ESWL);  Surgeon: Royston Cowper, MD;  Location: [facility] ORS;  Service: Urology;  Laterality: Right;   EXTRACORPOREAL SHOCK WAVE LITHOTRIPSY Left 03/15/2021   Procedure: EXTRACORPOREAL SHOCK WAVE LITHOTRIPSY (ESWL);  Surgeon: Royston Cowper, MD;  Location: [facility] ORS;  Service: Urology;  Laterality: Left;   PLANTAR FASCIA RELEASE Left    PLANTAR FASCIA RELEASE Left 12/25/2018   Procedure: PLANTAR FASCIA RELEASE/28060;  Surgeon: Sharlotte Alamo, DPM;  Location: [facility] ORS;  Service: Podiatry;  Laterality: Left;   ROTATOR CUFF REPAIR Bilateral    TONSILLECTOMY      History reviewed. No pertinent family history. Social History:  reports that [employer] [place] has never smoked. Leonie Douglas. [place] has never used smokeless tobacco. Leonie Douglas. [place] reports current alcohol use. Leonie Douglas. [place] reports that [employer] Guild does not use drugs.  Allergies:  Allergies  Allergen Reactions   Codeine Nausea Only    Medications Prior to  Admission  Medication Sig Dispense Refill   aspirin-acetaminophen-caffeine (EXCEDRIN MIGRAINE) 250-250-65 MG tablet Take 2 tablets by mouth every 6 (six) hours as needed for headache.      cetirizine (ZYRTEC) 10 MG tablet Take 10 mg by mouth at bedtime.     diclofenac sodium (VOLTAREN) 1 % GEL Apply 1 application  topically daily as needed (pain).     docusate sodium (COLACE) 100 MG capsule Take 100 mg by mouth 2 (two) times daily.     dutasteride (AVODART) 0.5 MG capsule Take 0.5 mg by mouth daily.     fenofibrate micronized (LOFIBRA) 134 MG capsule Take 134 mg by mouth daily before breakfast.     gabapentin (NEURONTIN) 300 MG capsule Take 600-900 mg by mouth as directed. Taking 2 capsules (600mg ) in the AM and 2 capsules (600 mg) in the afternoon and 3 capsules (900 mg) at bedtime.     hydrochlorothiazide (HYDRODIURIL) 25 MG tablet Take 25 mg by mouth daily.     irbesartan (AVAPRO) 300 MG tablet Take 300 mg by mouth at bedtime.      meloxicam (MOBIC) 15 MG tablet Take 15 mg by mouth at bedtime.     modafinil (PROVIGIL) 100 MG tablet Take 100 mg by mouth 2 (two) times daily.     Multiple Vitamin (MULTIVITAMIN WITH MINERALS) TABS tablet Take 1 tablet by mouth at bedtime.     nortriptyline (PAMELOR) 50 MG capsule Take 50 mg by mouth at bedtime.     omeprazole (PRILOSEC) 40 MG capsule Take 40 mg by mouth daily.     oxymetazoline (AFRIN) 0.05 %  nasal spray Place 1 spray into both nostrils 2 (two) times daily as needed for congestion.     simvastatin (ZOCOR) 40 MG tablet Take 40 mg by mouth at bedtime.      tamsulosin (FLOMAX) 0.4 MG CAPS capsule Take 1 capsule (0.4 mg total) by mouth daily. (Patient taking differently: Take 0.4 mg by mouth at bedtime.) 14 capsule 0   Testosterone Enanthate (XYOSTED) 100 MG/0.5ML SOAJ Inject 100 mg into the skin once a week. Tuesday     tiZANidine (ZANAFLEX) 4 MG tablet Take 4 mg by mouth every 8 (eight) hours as needed for muscle spasms.     traMADol (ULTRAM) 50 MG  tablet Take 50 mg by mouth every 6 (six) hours as needed for moderate pain.      Results for orders placed or performed during the hospital encounter of 05/03/22 (from the past 48 hour(s))  ABO/Rh     Status: None   Collection Time: 05/03/22  6:30 AM  Result Value Ref Range   ABO/RH(D)      O POS Performed at [facility] [address] [place], [place] [address]    No results found.  Pertinent items noted in HPI and remainder of comprehensive ROS otherwise negative.  Blood pressure 130/78, pulse 78, temperature 98.4 F (36.9 C), temperature source Oral, resp. rate 16, height 5\' 7"  (1.702 m), weight 95.3 kg, SpO2 97 %.  Patient is awake and alert.  He is oriented and appropriate.  Speech is fluent.  Judgment insight are intact.  Cranial nerve function normal bilateral.  Motor examination reveals intact motor strength bilateral.  Sensory examination with some decrease sensation in his left L3 and L4 dermatomes.  Deep tendon reflexes hypoactive but symmetric.  Gait normal.  Posture normal.  Examination head ears eyes nose and throat is unremarked.  Chest and abdomen benign.  Extremities free from injury or deformity. Assessment/Plan L3-4 degenerative disc disease with early degenerative retrolisthesis and foraminal stenosis with back pain and radiculopathy.  Plan left L3-4 anterior lateral retroperitoneal interbody decompression and fusion utilizing interbody cage, morselized allograft and coupled with posterior percutaneous pedicle screw fixation.  Risks and benefits been explained.  Patient wishes to proceed.  Cooper Render Markeita Alicia 05/03/2022, 7:54 AM

## 2022-05-03 NOTE — Transfer of Care (Signed)
Immediate Anesthesia Transfer of Care Note  Patient: Jonathan Lester  Procedure(s) Performed: EXTREME LATERAL INTERBODY FUSION LUMBAR THREE-LUMBAR FOUR LEFT POSTERIOR LATERAL AND INTERBODY FUSION (Left)  Patient Location: PACU  Anesthesia Type:General  Level of Consciousness: drowsy, patient cooperative, and responds to stimulation  Airway & Oxygen Therapy: Patient Spontanous Breathing and Patient connected to face mask oxygen  Post-op Assessment: Report given to RN and Post -op Vital signs reviewed and stable  Post vital signs: Reviewed and stable  Last Vitals:  Vitals Value Taken Time  BP 104/60 05/03/22 1015  Temp    Pulse 82 05/03/22 1016  Resp 11 05/03/22 1016  SpO2 98 % 05/03/22 1016  Vitals shown include unvalidated device data.  Last Pain:  Vitals:   05/03/22 0631  TempSrc: Oral  PainSc: 3          Complications: No notable events documented.

## 2022-05-03 NOTE — Anesthesia Procedure Notes (Signed)
Procedure Name: Intubation Date/Time: 05/03/2022 8:16 AM  Performed by: Cathren Harsh, CRNAPre-anesthesia Checklist: Patient identified, Emergency Drugs available, Suction available and Patient being monitored Patient Re-evaluated:Patient Re-evaluated prior to induction Oxygen Delivery Method: Circle System Utilized Preoxygenation: Pre-oxygenation with 100% oxygen Induction Type: IV induction Ventilation: Mask ventilation without difficulty Laryngoscope Size: Glidescope and 4 Grade View: Grade I Tube type: Oral Tube size: 7.5 mm Number of attempts: 1 Airway Equipment and Method: Stylet and Oral airway Placement Confirmation: ETT inserted through vocal cords under direct vision, positive ETCO2 and breath sounds checked- equal and bilateral Secured at: 23 cm Tube secured with: Tape Dental Injury: Teeth and Oropharynx as per pre-operative assessment  Comments: Intubation/mask ventilation by Lilia Pro, EMT.  Two handed mask ventilation with OAW.

## 2022-05-04 DIAGNOSIS — M5136 Other intervertebral disc degeneration, lumbar region: Secondary | ICD-10-CM | POA: Diagnosis not present

## 2022-05-04 MED ORDER — OXYCODONE HCL 10 MG PO TABS: 10.0000 mg | ORAL_TABLET | ORAL | 0 refills | Status: AC | PRN

## 2022-05-04 MED ORDER — METHOCARBAMOL 500 MG PO TABS: 500.0000 mg | ORAL_TABLET | Freq: Four times a day (QID) | ORAL | 2 refills | Status: AC | PRN

## 2022-05-04 NOTE — Progress Notes (Signed)
Patient is discharge from room 3C11 at this time. Alert and in stable condition. IV site d/c'd and instructions read to patient and spouse with understanding verbalized and all questions answered. Left unit via wheelchair with all belongings at side.

## 2022-05-04 NOTE — TOC Transition Note (Signed)
Transition of Care (TOC) - CM/SW Discharge Note Patient with order to DC to home today. Unit staff to provide DME needed for home.   No HH needs identified  Patient will have family/ friends provide transportation home. No other TOC needs identified for DC    Patient Details  Name: Jonathan Lester MRN: [id number] Date of Birth: 11-Mar-1961  Transition of Care [facility]) CM/SW Contact:  Carles Collet, RN Phone Number: 05/04/2022, 9:35 AM   Clinical Narrative:             Patient Goals and CMS Choice      Discharge Placement                         Discharge Plan and Services Additional resources added to the After Visit Summary for                                       Social Determinants of Health (SDOH) Interventions SDOH Screenings   Depression (PHQ2-9): Low Risk  (02/21/2020)  Tobacco Use: Low Risk  (05/03/2022)     Readmission Risk Interventions     No data to display

## 2022-05-04 NOTE — Discharge Instructions (Signed)

## 2022-05-04 NOTE — Evaluation (Signed)
Occupational Therapy Evaluation and Discharge Patient Details Name: Jonathan Lester MRN: [id number] DOB: 04/17/1960 Today's Date: 05/04/2022   History of Present Illness Pt is a 62 year old man admitted for L 3-4 ALIF/LIF. PMH: HTN, sleep apnea, headaches, OA.   Clinical Impression   Pt is functioning modified independently in ADLs and mobility. All education completed with pt verbalizing and/or demonstrating understanding. No further OT needs.      Recommendations for follow up therapy are one component of a multi-disciplinary discharge planning process, led by the attending physician.  Recommendations may be updated based on patient status, additional functional criteria and insurance authorization.   Follow Up Recommendations  No OT follow up     Assistance Recommended at Discharge Intermittent Supervision/Assistance  Patient can return home with the following Assistance with cooking/housework;Assist for transportation    Functional Status Assessment  Patient has had a recent decline in their functional status and demonstrates the ability to make significant improvements in function in a reasonable and predictable amount of time.  Equipment Recommendations  None recommended by OT    Recommendations for Other Services       Precautions / Restrictions Precautions Precautions: Back Precaution Booklet Issued: Yes (comment) Restrictions Weight Bearing Restrictions: No      Mobility Bed Mobility Overal bed mobility: Modified Independent             General bed mobility comments: pt has been using log roll technique prior to surgery    Transfers Overall transfer level: Modified independent Equipment used: None               General transfer comment: increased time      Balance Overall balance assessment: Mild deficits observed, not formally tested                                         ADL either performed or assessed with clinical  judgement   ADL Overall ADL's : Modified independent                                       General ADL Comments: Educated pt in compensatory strategies for ADLs and IADLs to avoid, reinforced with written handout.     Vision Baseline Vision/History: 1 Wears glasses Ability to See in Adequate Light: 0 Adequate Patient Visual Report: No change from baseline       Perception     Praxis      Pertinent Vitals/Pain Pain Assessment Pain Assessment: Faces Faces Pain Scale: Hurts a little bit Pain Location: back Pain Descriptors / Indicators: Discomfort Pain Intervention(s): Monitored during session, Premedicated before session     Hand Dominance Right   Extremity/Trunk Assessment Upper Extremity Assessment Upper Extremity Assessment: Overall WFL for tasks assessed   Lower Extremity Assessment Lower Extremity Assessment: Overall WFL for tasks assessed   Cervical / Trunk Assessment Cervical / Trunk Assessment: Back Surgery   Communication Communication Communication: No difficulties   Cognition Arousal/Alertness: Awake/alert Behavior During Therapy: WFL for tasks assessed/performed Overall Cognitive Status: Within Functional Limits for tasks assessed                                       General Comments  Exercises     Shoulder Instructions      Home Living Family/patient expects to be discharged to:: Private residence Living Arrangements: Spouse/significant other Available Help at Discharge: Family;Available PRN/intermittently Type of Home: House Home Access: Stairs to enter Entrance Stairs-Number of Steps: 2   Home Layout: Two level;Able to live on main level with bedroom/bathroom     Bathroom Shower/Tub: Occupational psychologist: Standard     Home Equipment: Conservation officer, nature (2 wheels)          Prior Functioning/Environment Prior Level of Function : Independent/Modified  Independent;Working/employed;Driving                        OT Problem List:        OT Treatment/Interventions:      OT Goals(Current goals can be found in the care plan section)    OT Frequency:      Co-evaluation              AM-PAC OT "6 Clicks" Daily Activity     Outcome Measure Help from another person eating meals?: None Help from another person taking care of personal grooming?: None Help from another person toileting, which includes using toliet, bedpan, or urinal?: None Help from another person bathing (including washing, rinsing, drying)?: None Help from another person to put on and taking off regular upper body clothing?: None Help from another person to put on and taking off regular lower body clothing?: None 6 Click Score: 24   End of Session    Activity Tolerance: Patient tolerated treatment well Patient left: in bed;with call bell/phone within reach  OT Visit Diagnosis: Pain                Time: 4098-1191 OT Time Calculation (min): 25 min Charges:  OT General Charges $OT Visit: 1 Visit OT Evaluation $OT Eval Low Complexity: 1 Low OT Treatments $Self Care/Home Management : 8-22 mins  Cleta Alberts, OTR/L Acute Rehabilitation Services Office: [phone]   Malka So 05/04/2022, 9:12 AM

## 2022-05-04 NOTE — Progress Notes (Signed)
PT Cancellation Note  Patient Details Name: Jonathan Lester MRN: [id number] DOB: 08-13-1960   Cancelled Treatment:    Reason Eval/Treat Not Completed: PT screened, no needs identified, will sign off (Pt screened by OT and is modI)  Wyona Almas, PT, DPT Acute Rehabilitation Services Office [place] 05/04/2022, 9:28 AM

## 2022-05-06 ENCOUNTER — Ambulatory Visit: Payer: BC Managed Care – PPO

## 2022-05-06 ENCOUNTER — Encounter (HOSPITAL_COMMUNITY): Payer: Self-pay | Admitting: Neurosurgery

## 2022-05-09 NOTE — Discharge Summary (Signed)
Physician Discharge Summary  Patient ID: Jonathan Lester MRN: [id number] DOB/AGE: 1960-08-30 62 y.o.  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  Retrolisthesis and instability L3-L4. Principal Problem:   Lumbar foraminal stenosis   Discharged Condition: good  Hospital Course: Patient was admitted to undergo surgery using an anterolateral approach with posterior fixation at L3-L4.  He tolerated surgery well he is discharged home.  Consults: None  Significant Diagnostic Studies: None  Treatments: See op note  Discharge Exam: Blood pressure 127/77, pulse 91, temperature 98.8 F (37.1 C), temperature source Oral, resp. rate 18, height 5\' 7"  (1.702 m), weight 95.3 kg, SpO2 97 %. Incision is clean and dry Station and gait are intact.  Disposition: Discharge disposition: 01-Home or Self Care       Discharge Instructions     Call MD for:  redness, tenderness, or signs of infection (pain, swelling, redness, odor or green/yellow discharge around incision site)   Complete by: As directed    Call MD for:  severe uncontrolled pain   Complete by: As directed    Call MD for:  temperature >100.4   Complete by: As directed    Diet - low sodium heart healthy   Complete by: As directed    Discharge instructions   Complete by: As directed    Wound care per Dr. Irven [place] instructions   Increase activity slowly   Complete by: As directed       Allergies as of 05/04/2022       Reactions   Codeine Nausea Only        Medication List     TAKE these medications    aspirin-acetaminophen-caffeine 250-250-65 MG tablet Commonly known as: EXCEDRIN MIGRAINE Take 2 tablets by mouth every 6 (six) hours as needed for headache.   cetirizine 10 MG tablet Commonly known as: ZYRTEC Take 10 mg by mouth at bedtime.   diclofenac sodium 1 % Gel Commonly  known as: VOLTAREN Apply 1 application  topically daily as needed (pain).   docusate sodium 100 MG capsule Commonly known as: COLACE Take 100 mg by mouth 2 (two) times daily.   dutasteride 0.5 MG capsule Commonly known as: AVODART Take 0.5 mg by mouth daily.   fenofibrate micronized 134 MG capsule Commonly known as: LOFIBRA Take 134 mg by mouth daily before breakfast.   gabapentin 300 MG capsule Commonly known as: NEURONTIN Take 600-900 mg by mouth as directed. Taking 2 capsules (600mg ) in the AM and 2 capsules (600 mg) in the afternoon and 3 capsules (900 mg) at bedtime.   hydrochlorothiazide 25 MG tablet Commonly known as: HYDRODIURIL Take 25 mg by mouth daily.   irbesartan 300 MG tablet Commonly known as: AVAPRO Take 300 mg by mouth at bedtime.   meloxicam 15 MG tablet Commonly known as: MOBIC Take 15 mg by mouth at bedtime.   methocarbamol 500 MG tablet Commonly known as: ROBAXIN Take 1 tablet (500 mg total) by mouth every 6 (six) hours as needed for muscle spasms.   modafinil 100 MG tablet Commonly known as: PROVIGIL Take 100 mg by mouth 2 (two) times daily.   multivitamin with minerals Tabs tablet Take 1 tablet by mouth at bedtime.   nortriptyline 50 MG capsule Commonly known as: PAMELOR Take 50 mg by mouth at bedtime.   omeprazole 40 MG capsule Commonly known as: PRILOSEC Take 40 mg by mouth daily.   Oxycodone  HCl 10 MG Tabs Take 1 tablet (10 mg total) by mouth every 4 (four) hours as needed for severe pain ((score 7 to 10)).   oxymetazoline 0.05 % nasal spray Commonly known as: AFRIN Place 1 spray into both nostrils 2 (two) times daily as needed for congestion.   simvastatin 40 MG tablet Commonly known as: ZOCOR Take 40 mg by mouth at bedtime.   tamsulosin 0.4 MG Caps capsule Commonly known as: Flomax Take 1 capsule (0.4 mg total) by mouth daily. What changed: when to take this   tiZANidine 4 MG tablet Commonly known as: ZANAFLEX Take 4 mg  by mouth every 8 (eight) hours as needed for muscle spasms.   traMADol 50 MG tablet Commonly known as: ULTRAM Take 50 mg by mouth every 6 (six) hours as needed for moderate pain.   Xyosted 100 MG/0.5ML Soaj Generic drug: Testosterone Enanthate Inject 100 mg into the skin once a week. Tuesday         Signed: Blanchie Dessert Sury Wentworth 05/09/2022, 11:22 AM

## 2022-06-28 ENCOUNTER — Other Ambulatory Visit: Payer: Self-pay | Admitting: Neurology

## 2022-06-28 DIAGNOSIS — M542 Cervicalgia: Secondary | ICD-10-CM

## 2022-07-04 ENCOUNTER — Ambulatory Visit
Admission: RE | Admit: 2022-07-04 | Discharge: 2022-07-04 | Disposition: A | Discharge status: Home / Self Care | Payer: No Typology Code available for payment source | Source: Ambulatory Visit | Attending: Neurology | Admitting: Neurology

## 2022-07-04 DIAGNOSIS — M542 Cervicalgia: Secondary | ICD-10-CM | POA: Diagnosis present

## 2022-12-04 ENCOUNTER — Other Ambulatory Visit: Payer: Self-pay | Admitting: Neurosurgery

## 2022-12-04 DIAGNOSIS — M5416 Radiculopathy, lumbar region: Secondary | ICD-10-CM

## 2022-12-11 ENCOUNTER — Ambulatory Visit
Admission: RE | Admit: 2022-12-11 | Discharge: 2022-12-11 | Disposition: A | Discharge status: Home / Self Care | Payer: No Typology Code available for payment source | Source: Ambulatory Visit | Attending: Neurosurgery | Admitting: Neurosurgery

## 2022-12-11 DIAGNOSIS — M5416 Radiculopathy, lumbar region: Secondary | ICD-10-CM | POA: Diagnosis present

## 2023-03-19 ENCOUNTER — Other Ambulatory Visit: Payer: Self-pay | Admitting: Neurosurgery

## 2023-03-19 DIAGNOSIS — M5416 Radiculopathy, lumbar region: Secondary | ICD-10-CM

## 2023-03-20 ENCOUNTER — Ambulatory Visit
Admission: RE | Admit: 2023-03-20 | Discharge: 2023-03-20 | Disposition: A | Discharge status: Home / Self Care | Payer: No Typology Code available for payment source | Source: Ambulatory Visit | Attending: Neurosurgery | Admitting: Neurosurgery

## 2023-03-20 DIAGNOSIS — M5416 Radiculopathy, lumbar region: Secondary | ICD-10-CM | POA: Insufficient documentation

## 2023-03-20 MED ORDER — GADOBUTROL 1 MMOL/ML IV SOLN
9.0000 mL | Freq: Once | INTRAVENOUS | Status: AC | PRN
  Administered 2023-03-20: 9 mL via INTRAVENOUS

## 2024-02-27 ENCOUNTER — Other Ambulatory Visit: Payer: Self-pay | Admitting: Neurosurgery

## 2024-02-27 DIAGNOSIS — M5416 Radiculopathy, lumbar region: Secondary | ICD-10-CM

## 2024-03-01 ENCOUNTER — Ambulatory Visit
Admission: RE | Admit: 2024-03-01 | Discharge: 2024-03-01 | Disposition: A | Discharge status: Home / Self Care | Source: Ambulatory Visit | Attending: Neurosurgery | Admitting: Neurosurgery

## 2024-03-01 DIAGNOSIS — M5416 Radiculopathy, lumbar region: Secondary | ICD-10-CM | POA: Diagnosis present

## 2024-03-24 ENCOUNTER — Ambulatory Visit
Admission: RE | Admit: 2024-03-24 | Discharge: 2024-03-24 | Disposition: A | Discharge status: Home / Self Care | Source: Ambulatory Visit | Attending: Physician Assistant | Admitting: Physician Assistant

## 2024-03-24 ENCOUNTER — Other Ambulatory Visit: Payer: Self-pay | Admitting: Physician Assistant

## 2024-03-24 DIAGNOSIS — R413 Other amnesia: Secondary | ICD-10-CM

## 2024-03-24 DIAGNOSIS — F40298 Other specified phobia: Secondary | ICD-10-CM

## 2024-03-24 DIAGNOSIS — R519 Headache, unspecified: Secondary | ICD-10-CM

## 2024-03-24 DIAGNOSIS — H02401 Unspecified ptosis of right eyelid: Secondary | ICD-10-CM | POA: Insufficient documentation

## 2024-03-24 DIAGNOSIS — H547 Unspecified visual loss: Secondary | ICD-10-CM

## 2024-03-24 MED ORDER — GADOBUTROL 1 MMOL/ML IV SOLN
9.0000 mL | Freq: Once | INTRAVENOUS | Status: AC | PRN
  Administered 2024-03-24: 20:00:00 9 mL via INTRAVENOUS
